# Patient Record
Sex: Male | Born: 1950
Health system: Southern US, Community
[De-identification: ages and names within clinical notes are randomized; demographics above are authoritative.]

## PROBLEM LIST (undated history)

## (undated) DIAGNOSIS — K219 Gastro-esophageal reflux disease without esophagitis: Secondary | ICD-10-CM

## (undated) DIAGNOSIS — T7840XA Allergy, unspecified, initial encounter: Secondary | ICD-10-CM

## (undated) DIAGNOSIS — I10 Essential (primary) hypertension: Secondary | ICD-10-CM

## (undated) DIAGNOSIS — M1990 Unspecified osteoarthritis, unspecified site: Secondary | ICD-10-CM

## (undated) HISTORY — DX: Allergy, unspecified, initial encounter: T78.40XA

## (undated) HISTORY — DX: Gastro-esophageal reflux disease without esophagitis: K21.9

## (undated) HISTORY — PX: FRACTURE SURGERY: SHX138

## (undated) HISTORY — DX: Unspecified osteoarthritis, unspecified site: M19.90

---

## 2004-09-10 ENCOUNTER — Ambulatory Visit: Payer: Self-pay | Admitting: Internal Medicine

## 2004-09-10 LAB — HM COLONOSCOPY

## 2009-11-08 ENCOUNTER — Emergency Department: Payer: Self-pay | Admitting: Emergency Medicine

## 2009-11-12 ENCOUNTER — Ambulatory Visit: Payer: Self-pay | Admitting: Unknown Physician Specialty

## 2012-05-06 ENCOUNTER — Ambulatory Visit: Payer: Self-pay | Admitting: Neurology

## 2012-06-06 ENCOUNTER — Ambulatory Visit: Payer: Self-pay | Admitting: Neurology

## 2012-07-04 ENCOUNTER — Ambulatory Visit: Payer: Self-pay | Admitting: Family Medicine

## 2012-07-29 ENCOUNTER — Ambulatory Visit: Payer: Self-pay | Admitting: Urology

## 2012-08-07 ENCOUNTER — Ambulatory Visit: Payer: Self-pay | Admitting: Urology

## 2012-08-08 LAB — PATHOLOGY REPORT

## 2014-08-31 ENCOUNTER — Emergency Department: Payer: Self-pay | Admitting: Emergency Medicine

## 2014-08-31 LAB — CBC WITH DIFFERENTIAL/PLATELET
BASOS ABS: 0 10*3/uL (ref 0.0–0.1)
Basophil %: 0.3 %
Eosinophil #: 0 10*3/uL (ref 0.0–0.7)
Eosinophil %: 0 %
HCT: 46.9 % (ref 40.0–52.0)
HGB: 15.3 g/dL (ref 13.0–18.0)
LYMPHS PCT: 7 %
Lymphocyte #: 0.9 10*3/uL — ABNORMAL LOW (ref 1.0–3.6)
MCH: 28.4 pg (ref 26.0–34.0)
MCHC: 32.8 g/dL (ref 32.0–36.0)
MCV: 87 fL (ref 80–100)
Monocyte #: 0.6 x10 3/mm (ref 0.2–1.0)
Monocyte %: 4.6 %
NEUTROS PCT: 88.1 %
Neutrophil #: 11.5 10*3/uL — ABNORMAL HIGH (ref 1.4–6.5)
Platelet: 180 10*3/uL (ref 150–440)
RBC: 5.41 10*6/uL (ref 4.40–5.90)
RDW: 13.9 % (ref 11.5–14.5)
WBC: 13 10*3/uL — ABNORMAL HIGH (ref 3.8–10.6)

## 2014-08-31 LAB — COMPREHENSIVE METABOLIC PANEL
Albumin: 4.1 g/dL (ref 3.4–5.0)
Alkaline Phosphatase: 71 U/L
Anion Gap: 12 (ref 7–16)
BILIRUBIN TOTAL: 0.7 mg/dL (ref 0.2–1.0)
BUN: 18 mg/dL (ref 7–18)
Calcium, Total: 8.9 mg/dL (ref 8.5–10.1)
Chloride: 99 mmol/L (ref 98–107)
Co2: 28 mmol/L (ref 21–32)
Creatinine: 1.16 mg/dL (ref 0.60–1.30)
EGFR (African American): >60
EGFR (Non-African Amer.): >60
Glucose: 123 mg/dL — ABNORMAL HIGH (ref 65–99)
Osmolality: 281 (ref 275–301)
Potassium: 3.8 mmol/L (ref 3.5–5.1)
SGOT(AST): 26 U/L (ref 15–37)
SGPT (ALT): 35 U/L
SODIUM: 139 mmol/L (ref 136–145)
Total Protein: 8.1 g/dL (ref 6.4–8.2)

## 2014-08-31 LAB — URINALYSIS, COMPLETE
Bacteria: NONE SEEN
Bilirubin,UR: NEGATIVE
Glucose,UR: NEGATIVE mg/dL (ref 0–75)
Leukocyte Esterase: NEGATIVE
Nitrite: NEGATIVE
Ph: 5 (ref 4.5–8.0)
Protein: 30
SPECIFIC GRAVITY: 1.024 (ref 1.003–1.030)

## 2014-08-31 LAB — LIPASE, BLOOD: LIPASE: 96 U/L (ref 73–393)

## 2014-09-03 ENCOUNTER — Ambulatory Visit: Payer: Self-pay | Admitting: Family Medicine

## 2014-11-13 ENCOUNTER — Ambulatory Visit: Payer: Self-pay | Admitting: Family Medicine

## 2014-11-16 ENCOUNTER — Ambulatory Visit: Payer: Self-pay | Admitting: Family Medicine

## 2014-11-19 ENCOUNTER — Ambulatory Visit: Payer: Self-pay | Admitting: Family Medicine

## 2015-02-11 LAB — HEPATIC FUNCTION PANEL
ALK PHOS: 59 U/L (ref 25–125)
ALT: 23 U/L (ref 10–40)
AST: 21 U/L (ref 14–40)

## 2015-02-11 LAB — LIPID PANEL
CHOLESTEROL: 208 mg/dL — AB (ref 0–200)
HDL: 34 mg/dL — AB (ref 35–70)
LDL Cholesterol: 148 mg/dL
LDl/HDL Ratio: 4.4
Triglycerides: 129 mg/dL (ref 40–160)

## 2015-02-11 LAB — CBC AND DIFFERENTIAL
HEMATOCRIT: 46 % (ref 41–53)
Hemoglobin: 15.1 g/dL (ref 13.5–17.5)
NEUTROS ABS: 3 /uL
Platelets: 185 10*3/uL (ref 150–399)
WBC: 5.3 10*3/mL

## 2015-02-11 LAB — BASIC METABOLIC PANEL
BUN: 18 mg/dL (ref 4–21)
Creatinine: 1.1 mg/dL (ref 0.6–1.3)
GLUCOSE: 95 mg/dL
Potassium: 5 mmol/L (ref 3.4–5.3)
SODIUM: 141 mmol/L (ref 137–147)

## 2015-02-11 LAB — TSH: TSH: 2.39 u[IU]/mL (ref 0.41–5.90)

## 2015-02-16 NOTE — Op Note (Signed)
PATIENT NAME:  Jonathon GrovesWILSON, Ollivander K MR#:  [id number] DATE OF BIRTH:  Mar 06, 1951  DATE OF PROCEDURE:  08/07/2012  PREOPERATIVE DIAGNOSIS: Phimosis with chronic balanitis.   POSTOPERATIVE DIAGNOSIS: Phimosis with chronic balanitis.   PROCEDURE: Circumcision.   SURGEON: Irineo AxonScott Stoioff, M.D.   ASSISTANT: None.   ANESTHESIA: General.   INDICATIONS: This is a 64 year old male with a six-month history of progressively worsening penile pain, ulceration, fissuring, inability to retract his foreskin. 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   DESCRIPTION OF PROCEDURE: The patient was taken to the Operating Room where a general anesthetic was administered. He was placed in the supine position and his external genitalia were prepped in the usual fashion. Time out was performed. His prepuce was unable to be retracted. The foreskin was excised via a dorsal/ventral slit technique. The intervening flaps of preputial tissue were sharply excised. Hemostasis was obtained with cautery. Frenulum was reapproximated with a running 4-0 chromic suture. Skin edges were reapproximated with interrupted 3-0 chromic suture. Dorsal penile block was performed with 7 mL of 0.5% plain Sensorcaine. Dressing of Vaseline gauze, Kling, and mesh was applied. He was taken to PAC-U in stable condition. There were no complications. EBL minimal. ____________________________ Verna CzechScott C. Lonna CobbStoioff, MD scs:slb D: 08/07/2012 08:47:18 ET     T: 08/07/2012 09:23:40 ET        JOB#: [id number] cc: Lorin PicketScott C. Lonna CobbStoioff, MD, <Dictator> Riki AltesSCOTT C STOIOFF MD ELECTRONICALLY SIGNED 08/07/2012 15:30

## 2015-08-06 DIAGNOSIS — I10 Essential (primary) hypertension: Secondary | ICD-10-CM | POA: Insufficient documentation

## 2015-08-06 DIAGNOSIS — M1990 Unspecified osteoarthritis, unspecified site: Secondary | ICD-10-CM | POA: Insufficient documentation

## 2015-08-06 DIAGNOSIS — L639 Alopecia areata, unspecified: Secondary | ICD-10-CM | POA: Insufficient documentation

## 2015-08-06 DIAGNOSIS — E785 Hyperlipidemia, unspecified: Secondary | ICD-10-CM | POA: Insufficient documentation

## 2015-08-06 DIAGNOSIS — M722 Plantar fascial fibromatosis: Secondary | ICD-10-CM | POA: Insufficient documentation

## 2015-08-06 DIAGNOSIS — K449 Diaphragmatic hernia without obstruction or gangrene: Secondary | ICD-10-CM | POA: Insufficient documentation

## 2015-08-06 DIAGNOSIS — E668 Other obesity: Secondary | ICD-10-CM | POA: Insufficient documentation

## 2015-08-06 DIAGNOSIS — N529 Male erectile dysfunction, unspecified: Secondary | ICD-10-CM | POA: Insufficient documentation

## 2015-08-09 ENCOUNTER — Encounter: Payer: Self-pay | Admitting: Family Medicine

## 2015-08-09 ENCOUNTER — Ambulatory Visit (INDEPENDENT_AMBULATORY_CARE_PROVIDER_SITE_OTHER): Payer: PRIVATE HEALTH INSURANCE | Admitting: Family Medicine

## 2015-08-09 VITALS — BP 160/82 | HR 62 | Temp 97.3°F | Resp 16 | Ht 71.0 in | Wt 221.0 lb

## 2015-08-09 DIAGNOSIS — R634 Abnormal weight loss: Secondary | ICD-10-CM

## 2015-08-09 DIAGNOSIS — I10 Essential (primary) hypertension: Secondary | ICD-10-CM

## 2015-08-09 DIAGNOSIS — E669 Obesity, unspecified: Secondary | ICD-10-CM | POA: Insufficient documentation

## 2015-08-09 DIAGNOSIS — E785 Hyperlipidemia, unspecified: Secondary | ICD-10-CM

## 2015-08-09 DIAGNOSIS — M109 Gout, unspecified: Secondary | ICD-10-CM | POA: Insufficient documentation

## 2015-08-09 NOTE — Progress Notes (Signed)
Patient ID: Jonathon Bell, male   DOB: 12-02-1950, 64 y.o.   MRN: [id number]   Jonathon Bell  MRN: [id number] DOB: 04-30-1951  Subjective:  HPI   1. Essential hypertension Patient is a 64 year old male who presents for follow up of his hypertension.  His last visit was 6 months ago.  No management changes were made at that time and his reading in the office was 132/70.  Since his last visit the patient has changed his eating habits and is going to the gym twice per week.  He has lost 75 pounds during that time. He has discontinued all medications.  He reported that he was having some dizziness and thought it to be his blood pressure.  He has had his pressure checked outside of our office since stopping the Lisinopril and reports it was good, 122/78 and 126/77 another time.  He feels well and has no complaints today.   Patient Active Problem List   Diagnosis Date Noted  . AA (alopecia areata) 08/06/2015  . Arthritis 08/06/2015  . Failure of erection 08/06/2015  . Essential (primary) hypertension 08/06/2015  . 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 HLD (hyperlipidemia) 08/06/2015  . Extreme obesity (HCC) 08/06/2015  . Plantar fasciitis 08/06/2015    No past medical history on file.  Social History   Social History  . Marital Status: Divorced    Spouse Name: N/A  . Number of Children: N/A  . Years of Education: N/A   Occupational History  . Not on file.   Social History Main Topics  . Smoking status: Former Smoker -- 0.50 packs/day for 6 years    Types: Cigarettes  . Smokeless tobacco: Former Neurosurgeon    Quit date: 10/31/1979  . Alcohol Use: No  . Drug Use: No  . Sexual Activity: Not on file   Other Topics Concern  . Not on file   Social History Narrative    Outpatient Prescriptions Prior to Visit  Medication Sig Dispense Refill  . Glucosamine-Chondroit-Vit C-Mn (GLUCOSAMINE CHONDR 1500 COMPLX) CAPS Take by mouth.    Marland Kitchen lisinopril-hydrochlorothiazide (PRINZIDE,ZESTORETIC)  20-12.5 MG tablet Take by mouth.    . Naproxen Sod-Diphenhydramine (ALEVE PM) 220-25 MG TABS Take by mouth.    Marland Kitchen omeprazole (PRILOSEC) 20 MG capsule Take by mouth.    . ondansetron (ZOFRAN) 8 MG tablet Take by mouth.    . promethazine (PHENERGAN) 25 MG suppository Place rectally.     No facility-administered medications prior to visit.    No Known Allergies  Review of Systems  Constitutional: Negative for fever and chills.  Eyes: Negative.   Respiratory: Negative.   Cardiovascular: Negative.   Gastrointestinal: Negative.        Patient states he does feel like he has gas.  Genitourinary: Negative.   Neurological: Negative.  Negative for weakness and headaches.  Endo/Heme/Allergies: Negative.   Psychiatric/Behavioral: Negative.    Objective:  BP 160/82 mmHg  Pulse 62  Temp(Src) 97.3 F (36.3 C) (Oral)  Resp 16  Ht  (1.803 m)  Wt 221 lb (100.245 kg)  BMI 30.84 kg/m2  Physical Exam  Constitutional: He is oriented to person, place, and time and well-developed, well-nourished, and in no distress.  HENT:  Head: Normocephalic and atraumatic.  Right Ear: External ear normal.  Left Ear: External ear normal.  Nose: Nose normal.  Eyes: Conjunctivae are normal.  Neck: Neck supple.  Cardiovascular: Normal rate, regular rhythm and normal heart sounds.   Pulmonary/Chest: Effort normal and  breath sounds normal.  Abdominal: Soft.  Neurological: He is alert and oriented to person, place, and time.  Skin: Skin is warm and dry.  Psychiatric: Mood, memory, affect and judgment normal.    Assessment and Plan :  Essential hypertension  OK off of meds with this weight loss. RTC 3 months with home BP readings,Seemsd to have some "white coat hypertension". Morbid Obesity Pt says the 75 lb weight loss is all intentional and he feels well. Obtain labs to f/u on this weight loss. Alopecia Areata HLD  Julieanne Manson MD [facility] 08/09/2015 8:16 AM

## 2015-08-10 LAB — CBC WITH DIFFERENTIAL/PLATELET
BASOS ABS: 0 10*3/uL (ref 0.0–0.2)
Basos: 0 %
EOS (ABSOLUTE): 0.2 10*3/uL (ref 0.0–0.4)
EOS: 4 %
HEMATOCRIT: 44.5 % (ref 37.5–51.0)
HEMOGLOBIN: 15 g/dL (ref 12.6–17.7)
IMMATURE GRANULOCYTES: 0 %
Immature Grans (Abs): 0 10*3/uL (ref 0.0–0.1)
LYMPHS ABS: 1 10*3/uL (ref 0.7–3.1)
LYMPHS: 25 %
MCH: 28.4 pg (ref 26.6–33.0)
MCHC: 33.7 g/dL (ref 31.5–35.7)
MCV: 84 fL (ref 79–97)
MONOCYTES: 8 %
Monocytes Absolute: 0.3 10*3/uL (ref 0.1–0.9)
NEUTROS PCT: 63 %
Neutrophils Absolute: 2.6 10*3/uL (ref 1.4–7.0)
Platelets: 143 10*3/uL — ABNORMAL LOW (ref 150–379)
RBC: 5.28 x10E6/uL (ref 4.14–5.80)
RDW: 15.2 % (ref 12.3–15.4)
WBC: 4.1 10*3/uL (ref 3.4–10.8)

## 2015-08-10 LAB — COMPREHENSIVE METABOLIC PANEL
ALBUMIN: 4.4 g/dL (ref 3.6–4.8)
ALK PHOS: 76 IU/L (ref 39–117)
ALT: 8 IU/L (ref 0–44)
AST: 13 IU/L (ref 0–40)
Albumin/Globulin Ratio: 1.8 (ref 1.1–2.5)
BUN/Creatinine Ratio: 13 (ref 10–22)
BUN: 11 mg/dL (ref 8–27)
Bilirubin Total: 0.6 mg/dL (ref 0.0–1.2)
CALCIUM: 9.2 mg/dL (ref 8.6–10.2)
CO2: 22 mmol/L (ref 18–29)
CREATININE: 0.82 mg/dL (ref 0.76–1.27)
Chloride: 102 mmol/L (ref 97–108)
GFR calc Af Amer: 110 mL/min/{1.73_m2} (ref >59)
GFR, EST NON AFRICAN AMERICAN: 95 mL/min/{1.73_m2} (ref >59)
GLUCOSE: 92 mg/dL (ref 65–99)
Globulin, Total: 2.5 g/dL (ref 1.5–4.5)
Potassium: 4.4 mmol/L (ref 3.5–5.2)
Sodium: 142 mmol/L (ref 134–144)
Total Protein: 6.9 g/dL (ref 6.0–8.5)

## 2015-08-10 LAB — LIPID PANEL WITH LDL/HDL RATIO
CHOLESTEROL TOTAL: 251 mg/dL — AB (ref 100–199)
HDL: 37 mg/dL — AB (ref >39)
LDL CALC: 194 mg/dL — AB (ref 0–99)
LDl/HDL Ratio: 5.2 ratio units — ABNORMAL HIGH (ref 0.0–3.6)
Triglycerides: 98 mg/dL (ref 0–149)
VLDL CHOLESTEROL CAL: 20 mg/dL (ref 5–40)

## 2015-08-10 LAB — TSH: TSH: 2.01 u[IU]/mL (ref 0.450–4.500)

## 2015-08-11 ENCOUNTER — Other Ambulatory Visit: Payer: Self-pay | Admitting: Emergency Medicine

## 2015-08-11 DIAGNOSIS — E785 Hyperlipidemia, unspecified: Secondary | ICD-10-CM

## 2015-08-11 MED ORDER — ATORVASTATIN CALCIUM 10 MG PO TABS: 10.0000 mg | ORAL_TABLET | Freq: Every day | ORAL | Status: DC

## 2015-11-15 ENCOUNTER — Other Ambulatory Visit: Payer: Self-pay

## 2016-08-14 IMAGING — NM NUCLEAR MEDICINE HEPATOHBILIARY INCLUDE GB
2 series · 12 of 12 positions shown · non-contrast
Comparison: None ; correlation ultrasound abdomen 11/16/2014

CLINICAL DATA: Nausea, vomiting, abdominal pain, negative
ultrasound

EXAM:
NUCLEAR MEDICINE HEPATOBILIARY IMAGING WITH GALLBLADDER EF
TECHNIQUE: Sequential images of the abdomen were obtained [DATE] minutes
following intravenous administration of radiopharmaceutical. After
slow intravenous infusion of 2.61 micrograms Cholecystokinin,
gallbladder ejection fraction was determined.
RADIOPHARMACEUTICALS:  8.79 mCi Rc-AAm Choletec IV

[Series 1000: gallbladder dynamic (results) · 4.80mm/px · 6 of 120 frames shown]
[frame 11/120]
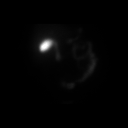
[frame 31/120]
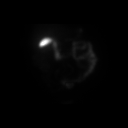
[frame 51/120]
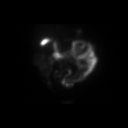
[frame 71/120]
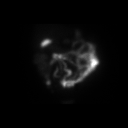
[frame 91/120]
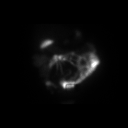
[frame 111/120]
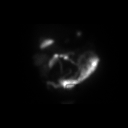

[Series 1000: gallbladder dynamic · 4.80mm/px · 6 of 120 frames shown]
[frame 11/120]
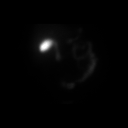
[frame 31/120]
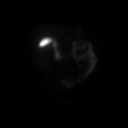
[frame 51/120]
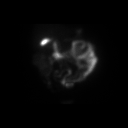
[frame 71/120]
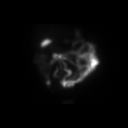
[frame 91/120]
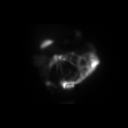
[frame 111/120]
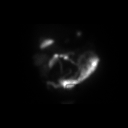

[12 of 12 positions shown; findings below may reference images not displayed]

FINDINGS: Normal tracer extraction from bloodstream indicating normal
hepatocellular function.

Normal excretion of tracer into biliary tree.

Gallbladder visualized at 10 min.

Small bowel visualized at 60 min.

No hepatic retention of tracer.

Subjectively normal emptying of tracer from gallbladder following
CCK administration.

Calculated gallbladder ejection fraction is 76%, normal.

Patient experienced a mild gas like feeling following CCK
administration.
IMPRESSION: Normal exam.

## 2017-10-31 ENCOUNTER — Ambulatory Visit (INDEPENDENT_AMBULATORY_CARE_PROVIDER_SITE_OTHER): Payer: 59 | Admitting: Family Medicine

## 2017-10-31 ENCOUNTER — Encounter: Payer: Self-pay | Admitting: Family Medicine

## 2017-10-31 VITALS — BP 120/80 | HR 92 | Temp 98.2°F | Resp 16 | Ht 71.0 in | Wt 246.0 lb

## 2017-10-31 DIAGNOSIS — I10 Essential (primary) hypertension: Secondary | ICD-10-CM | POA: Diagnosis not present

## 2017-10-31 DIAGNOSIS — E785 Hyperlipidemia, unspecified: Secondary | ICD-10-CM | POA: Diagnosis not present

## 2017-10-31 DIAGNOSIS — Z0000 Encounter for general adult medical examination without abnormal findings: Secondary | ICD-10-CM

## 2017-10-31 LAB — IFOBT (OCCULT BLOOD): IFOBT: NEGATIVE

## 2017-10-31 NOTE — Progress Notes (Signed)
poct      Patient: Jonathon Bell, Male    DOB: 11/27/1950, 67 y.o.   MRN: [id number] Visit Date: 10/31/2017  Today's Provider: Megan Mansichard Gilbert Jr, MD   Chief Complaint  Patient presents with  . Medicare Wellness   Subjective:    Annual physical exam Jonathon Bell is a 67 y.o. male who presents today for health maintenance and complete physical. He feels fairly well. He reports exercising none. He reports he is sleeping fairly well. 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 He has 3 children and 2 grandchildren.  ----------------------------------------------------------------    Hypertension, follow-up:  BP Readings from Last 3 Encounters:  10/31/17 120/80  08/09/15 (!) 160/82  02/08/15 132/70    He was last seen for hypertension 08/09/2015.  Management since that visit includes; off medications. Stable with weight loss. Advised to follow-up in 3 months.He reports good compliance with treatment. He is not having side effects. none He is not exercising. He is not adherent to low salt diet.   Outside blood pressures are not checking. He is experiencing none.  Patient denies none.   Cardiovascular risk factors include advanced age (older than 5055 for men, 1765 for women).  Use of agents associated with hypertension: none.   ----------------------------------------------------------------  Morbid Obesity From 08/09/2015-Pt reports 75 lb weight loss and he feels well.  Alopecia Areata From 08/09/2015-HLD  Labs done 08/09/2015-showing cholesterol was to high. Started atorvastatin 10 mg qd.   Review of Systems  Constitutional: Negative.   HENT: Negative.   Eyes: Negative.   Respiratory: Negative.   Cardiovascular: Negative.   Gastrointestinal: Negative.   Endocrine: Negative.   Genitourinary: Negative.   Musculoskeletal: Negative.   Allergic/Immunologic: Negative.   Neurological: Negative.   Hematological: Negative.   Psychiatric/Behavioral: Negative.       Social History      He  reports that he has quit smoking. His smoking use included cigarettes. He has a 3.00 pack-year smoking history. He quit smokeless tobacco use about 38 years ago. He reports that he does not drink alcohol or use drugs.       Social History   Socioeconomic History  . Marital status: Divorced    Spouse name: None  . Number of children: None  . Years of education: None  . Highest education level: None  Social Needs  . Financial resource strain: None  . Food insecurity - worry: None  . Food insecurity - inability: None  . Transportation needs - medical: None  . Transportation needs - non-medical: None  Occupational History  . None  Tobacco Use  . Smoking status: Former Smoker    Packs/day: 0.50    Years: 6.00    Pack years: 3.00    Types: Cigarettes  . Smokeless tobacco: Former NeurosurgeonUser    Quit date: 10/31/1979  Substance and Sexual Activity  . Alcohol use: No    Alcohol/week: 0.0 oz  . Drug use: No  . Sexual activity: None  Other Topics Concern  . None  Social History Narrative  . None    History reviewed. No pertinent past medical history.   Patient Active Problem List   Diagnosis Date Noted  . Gout 08/09/2015  . Adiposity 08/09/2015  . AA (alopecia areata) 08/06/2015  . Arthritis 08/06/2015  . Failure of erection 08/06/2015  . Essential (primary) hypertension 08/06/2015  . 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 HLD (hyperlipidemia) 08/06/2015  . Extreme obesity 08/06/2015  . Plantar fasciitis 08/06/2015  Past Surgical History:  Procedure Laterality Date  . FRACTURE SURGERY     arm requiring surgery on tendons    Family History        Family Status  Relation Name Status  . Mother  Deceased at age 36  . Father  Deceased at age 70  . Sister  Deceased  . Brother  Alive        His family history includes Cirrhosis in his sister; Diabetes in his sister; Heart disease in his mother and sister; Hypertension in his mother; Lung cancer in  his father.     No Known Allergies   Current Outpatient Medications:  .  atorvastatin (LIPITOR) 10 MG tablet, Take 1 tablet (10 mg total) by mouth daily. (Patient not taking: Reported on 10/31/2017), Disp: 30 tablet, Rfl: 12   Patient Care Team: Maple Hudson., MD as PCP - General (Family Medicine)      Objective:   Vitals: BP 120/80 (BP Location: Left Arm, Patient Position: Sitting, Cuff Size: Large)   Pulse 92   Temp 98.2 F (36.8 C) (Oral)   Resp 16   Ht 5\' 11"  (1.803 m)   Wt 246 lb (111.6 kg)   SpO2 96%   BMI 34.31 kg/m    Vitals:   10/31/17 1014  BP: 120/80  Pulse: 92  Resp: 16  Temp: 98.2 F (36.8 C)  TempSrc: Oral  SpO2: 96%  Weight: 246 lb (111.6 kg)  Height: 5\' 11"  (1.803 m)     Physical Exam  Constitutional: He is oriented to person, place, and time. He appears well-developed and well-nourished.  HENT:  Head: Normocephalic and atraumatic.  Right Ear: External ear normal.  Left Ear: External ear normal.  Nose: Nose normal.  Mouth/Throat: Oropharynx is clear and moist.  Eyes: Conjunctivae are normal. No scleral icterus.  Neck: No thyromegaly present.  Cardiovascular: Normal rate, regular rhythm, normal heart sounds and intact distal pulses.  Pulmonary/Chest: Effort normal and breath sounds normal.  Abdominal: Soft.  Genitourinary: Rectum normal, prostate normal and penis normal.  Musculoskeletal: Normal range of motion.  Lymphadenopathy:    He has no cervical adenopathy.  Neurological: He is alert and oriented to person, place, and time.  Skin: Skin is warm and dry.  Psychiatric: He has a normal mood and affect. His behavior is normal. Judgment and thought content normal.     Depression Screen PHQ 2/9 Scores 10/31/2017  PHQ - 2 Score 1  PHQ- 9 Score 3     Visual Acuity Screening   Right eye Left eye Both eyes  Without correction: 20/25 20/20 20/20   With correction:        Assessment & Plan:     Routine Health Maintenance and  Physical Exam  Exercise Activities and Dietary recommendations Goals    None      Immunization History  Administered Date(s) Administered  . Td 08/11/2004  . Tdap 05/26/2014  . Zoster 05/26/2014    Health Maintenance  Topic Date Due  . Hepatitis C Screening  1951-02-16  . HIV Screening  10/18/1967  . COLONOSCOPY  09/10/2014  . INFLUENZA VACCINE  05/30/2018 (Originally 05/30/2017)  . PNA vac Low Risk Adult (1 of 2 - PCV13) 10/31/2018 (Originally 10/17/2017)  . TETANUS/TDAP  05/26/2024     Discussed health benefits of physical activity, and encouraged him to engage in regular exercise appropriate for his age and condition.  Obesity Pt has kept off 50 lbs from 75 that he lost in  last 2 years.   --------------------------------------------------------------------   I have done the exam and reviewed the above chart and it is accurate to the best of my knowledge. Dentist has been used in this note in any air is in the dictation or transcription are unintentional.  Megan Mans, MD  [facility]

## 2017-10-31 NOTE — Progress Notes (Deleted)
Patient: Jonathon Bell, Male    DOB: 09-Jan-1951, 67 y.o.   MRN: [id number] Visit Date: 10/31/2017  Today's Provider: Megan Mans, MD   Chief Complaint  Patient presents with  . Medicare Wellness   Subjective:   Initial preventative physical exam Jonathon Bell is a 67 y.o. male who presents today for his Initial Preventative Physical Exam. He feels well. He reports exercising none. He reports he is sleeping fairly well.     Hypertension, follow-up:  BP Readings from Last 3 Encounters:  10/31/17 120/80  08/09/15 (!) 160/82  02/08/15 132/70    He was last seen for hypertension 08/09/2015.  Management since that visit includes; off medications. Stable with weight loss. Advised to follow-up in 3 months.He reports good compliance with treatment. He is not having side effects. none He is not exercising. He is not adherent to low salt diet.   Outside blood pressures are not checking. He is experiencing none.  Patient denies none.   Cardiovascular risk factors include advanced age (older than 31 for men, 49 for women).  Use of agents associated with hypertension: none.   ----------------------------------------------------------------  Morbid Obesity From 08/09/2015-Pt reports 75 lb weight loss and he feels well.  Alopecia Areata From 08/09/2015-HLD  Labs done 08/09/2015-showing cholesterol was to high. Started atorvastatin 10 mg qd.   Review of Systems  HENT: Positive for tinnitus.   Musculoskeletal: Positive for arthralgias.  All other systems reviewed and are negative.   Social History   Socioeconomic History  . Marital status: Divorced    Spouse name: Not on file  . Number of children: Not on file  . Years of education: Not on file  . Highest education level: Not on file  Social Needs  . Financial resource strain: Not on file  . Food insecurity - worry: Not on file  . Food insecurity - inability: Not on file  . Transportation needs -  medical: Not on file  . Transportation needs - non-medical: Not on file  Occupational History  . Not on file  Tobacco Use  . Smoking status: Former Smoker    Packs/day: 0.50    Years: 6.00    Pack years: 3.00    Types: Cigarettes  . Smokeless tobacco: Former Neurosurgeon    Quit date: 10/31/1979  Substance and Sexual Activity  . Alcohol use: No    Alcohol/week: 0.0 oz  . Drug use: No  . Sexual activity: Not on file  Other Topics Concern  . Not on file  Social History Narrative  . Not on file    History reviewed. No pertinent past medical history.   Patient Active Problem List   Diagnosis Date Noted  . Gout 08/09/2015  . Adiposity 08/09/2015  . AA (alopecia areata) 08/06/2015  . Arthritis 08/06/2015  . Failure of erection 08/06/2015  . Essential (primary) hypertension 08/06/2015  . 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 HLD (hyperlipidemia) 08/06/2015  . Extreme obesity 08/06/2015  . Plantar fasciitis 08/06/2015    Past Surgical History:  Procedure Laterality Date  . FRACTURE SURGERY     arm requiring surgery on tendons    His family history includes Cirrhosis in his sister; Diabetes in his sister; Heart disease in his mother and sister; Hypertension in his mother; Lung cancer in his father.      Current Outpatient Medications:  .  atorvastatin (LIPITOR) 10 MG tablet, Take 1 tablet (10 mg total) by mouth daily. (Patient not taking: Reported  on 10/31/2017), Disp: 30 tablet, Rfl: 12   Patient Care Team: Jonathon Bell., MD as PCP - General (Family Medicine)      Objective:   Vitals: BP 120/80 (BP Location: Left Arm, Patient Position: Sitting, Cuff Size: Large)   Pulse 92   Temp 98.2 F (36.8 C) (Oral)   Resp 16   Ht 5\' 11"  (1.803 m)   Wt 246 lb (111.6 kg)   SpO2 96%   BMI 34.31 kg/m   Physical Exam    Visual Acuity Screening   Right eye Left eye Both eyes  Without correction: 20/25 20/20 20/20   With correction:       Activities of Daily Living In your  present state of health, do you have any difficulty performing the following activities: 10/31/2017  Hearing? Y  Vision? N  Difficulty concentrating or making decisions? N  Walking or climbing stairs? N  Dressing or bathing? N  Doing errands, shopping? N  Some recent data might be hidden    Fall Risk Assessment Fall Risk  10/31/2017  Falls in the past year? Yes  Number falls in past yr: 1  Injury with Fall? Yes  Comment broken elbow at work     Depression Screen PHQ 2/9 Scores 10/31/2017  PHQ - 2 Score 1  PHQ- 9 Score 3    Cognitive Testing - 6-CIT  Correct? Score   What year is it? yes 0 0 or 4  What month is it? yes 0 0 or 3  Memorize:    Jonathon Bell,  43,  High [address]  [place],      What time is it? (within 1 hour) yes 0 0 or 3  Count backwards from 20 yes 0 0, 2, or 4  Name the months of the year yes 0 0, 2, or 4  Repeat name & address above yes 6 0, 2, 4, 6, 8, or 10       TOTAL SCORE  6/28   Interpretation:  Normal  Normal (0-7) Abnormal (8-28)   Audit-C Alcohol Use Screening  Question Answer Points  How often do you have alcoholic drink? never 0  On days you do drink alcohol, how many drinks do you typically consume? 0 0  How oftey will you drink 6 or more in a total? never 0  Total Score:  0   A score of 3 or more in women, and 4 or more in men indicates increased risk for alcohol abuse, EXCEPT if all of the points are from question 1.     Assessment & Plan:     Initial Preventative Physical Exam  Reviewed patient's Family Medical History Reviewed and updated list of patient's medical providers Assessment of cognitive impairment was done Assessed patient's functional ability Established a written schedule for health screening services Health Risk Assessent Completed and Reviewed  Exercise Activities and Dietary recommendations Goals    None      Immunization History  Administered Date(s) Administered  . Td 08/11/2004  . Tdap 05/26/2014  .  Zoster 05/26/2014    Health Maintenance  Topic Date Due  . Hepatitis C Screening  June 07, 1951  . HIV Screening  10/18/1967  . COLONOSCOPY  09/10/2014  . INFLUENZA VACCINE  05/30/2017  . PNA vac Low Risk Adult (1 of 2 - PCV13) 10/17/2017  . TETANUS/TDAP  05/26/2024     Discussed health benefits of physical activity, and encouraged him to engage in regular exercise appropriate for his age and  condition.    --------------------------------------------------------------------------    Megan Mansichard Gilbert Jr, MD  [facility]

## 2017-10-31 NOTE — Patient Instructions (Signed)
Get labs done that were , Lipid panel, CBC, TSH, CMET, and PSA  Return for office follow-up in 6 months.

## 2017-11-01 LAB — PSA: PROSTATE SPECIFIC AG, SERUM: 1 ng/mL (ref 0.0–4.0)

## 2017-11-01 LAB — LIPID PANEL
CHOLESTEROL TOTAL: 261 mg/dL — AB (ref 100–199)
Chol/HDL Ratio: 5.3 ratio — ABNORMAL HIGH (ref 0.0–5.0)
HDL: 49 mg/dL (ref >39)
LDL CALC: 197 mg/dL — AB (ref 0–99)
Triglycerides: 73 mg/dL (ref 0–149)
VLDL Cholesterol Cal: 15 mg/dL (ref 5–40)

## 2017-11-01 LAB — CBC WITH DIFFERENTIAL/PLATELET
BASOS: 0 %
Basophils Absolute: 0 10*3/uL (ref 0.0–0.2)
EOS (ABSOLUTE): 0.1 10*3/uL (ref 0.0–0.4)
EOS: 3 %
HEMATOCRIT: 44 % (ref 37.5–51.0)
HEMOGLOBIN: 15.1 g/dL (ref 13.0–17.7)
IMMATURE GRANS (ABS): 0 10*3/uL (ref 0.0–0.1)
Immature Granulocytes: 1 %
LYMPHS: 23 %
Lymphocytes Absolute: 1 10*3/uL (ref 0.7–3.1)
MCH: 27.6 pg (ref 26.6–33.0)
MCHC: 34.3 g/dL (ref 31.5–35.7)
MCV: 80 fL (ref 79–97)
Monocytes Absolute: 0.3 10*3/uL (ref 0.1–0.9)
Monocytes: 7 %
NEUTROS ABS: 2.8 10*3/uL (ref 1.4–7.0)
Neutrophils: 66 %
Platelets: 157 10*3/uL (ref 150–379)
RBC: 5.48 x10E6/uL (ref 4.14–5.80)
RDW: 14.5 % (ref 12.3–15.4)
WBC: 4.2 10*3/uL (ref 3.4–10.8)

## 2017-11-01 LAB — COMPREHENSIVE METABOLIC PANEL
A/G RATIO: 2 (ref 1.2–2.2)
ALBUMIN: 4.9 g/dL — AB (ref 3.6–4.8)
ALT: 26 IU/L (ref 0–44)
AST: 20 IU/L (ref 0–40)
Alkaline Phosphatase: 47 IU/L (ref 39–117)
BILIRUBIN TOTAL: 0.3 mg/dL (ref 0.0–1.2)
BUN / CREAT RATIO: 17 (ref 10–24)
BUN: 17 mg/dL (ref 8–27)
CALCIUM: 9.2 mg/dL (ref 8.6–10.2)
CHLORIDE: 103 mmol/L (ref 96–106)
CO2: 23 mmol/L (ref 20–29)
Creatinine, Ser: 1.02 mg/dL (ref 0.76–1.27)
GFR calc Af Amer: 89 mL/min/{1.73_m2} (ref >59)
GFR, EST NON AFRICAN AMERICAN: 77 mL/min/{1.73_m2} (ref >59)
GLOBULIN, TOTAL: 2.4 g/dL (ref 1.5–4.5)
Glucose: 96 mg/dL (ref 65–99)
POTASSIUM: 4.7 mmol/L (ref 3.5–5.2)
Sodium: 142 mmol/L (ref 134–144)
Total Protein: 7.3 g/dL (ref 6.0–8.5)

## 2017-11-01 LAB — TSH: TSH: 2.62 u[IU]/mL (ref 0.450–4.500)

## 2017-11-05 ENCOUNTER — Telehealth: Payer: Self-pay

## 2017-11-05 NOTE — Telephone Encounter (Signed)
-----   Message from Maple Hudsonichard L Gilbert Jr., MD sent at 11/05/2017  8:21 AM EST ----- Recheck lipids after 6 months D and E--add metamucil 1 scoop daily.

## 2017-11-05 NOTE — Telephone Encounter (Signed)
Pt advised.   Thanks,   -Laura  

## 2017-11-28 ENCOUNTER — Encounter: Payer: Self-pay | Admitting: Family Medicine

## 2018-08-23 DIAGNOSIS — M25561 Pain in right knee: Secondary | ICD-10-CM | POA: Diagnosis not present

## 2018-08-23 DIAGNOSIS — S8991XA Unspecified injury of right lower leg, initial encounter: Secondary | ICD-10-CM | POA: Diagnosis not present

## 2018-08-23 DIAGNOSIS — M7989 Other specified soft tissue disorders: Secondary | ICD-10-CM | POA: Diagnosis not present

## 2018-08-23 DIAGNOSIS — M1711 Unilateral primary osteoarthritis, right knee: Secondary | ICD-10-CM | POA: Diagnosis not present

## 2020-05-19 DIAGNOSIS — M170 Bilateral primary osteoarthritis of knee: Secondary | ICD-10-CM | POA: Insufficient documentation

## 2021-01-19 ENCOUNTER — Other Ambulatory Visit: Payer: Self-pay

## 2021-01-19 ENCOUNTER — Ambulatory Visit (INDEPENDENT_AMBULATORY_CARE_PROVIDER_SITE_OTHER): Payer: Medicare Other | Admitting: Family Medicine

## 2021-01-19 ENCOUNTER — Encounter: Payer: Self-pay | Admitting: Family Medicine

## 2021-01-19 VITALS — BP 161/82 | HR 70 | Temp 98.5°F | Resp 16 | Ht 72.0 in | Wt 290.0 lb

## 2021-01-19 DIAGNOSIS — Z6839 Body mass index (BMI) 39.0-39.9, adult: Secondary | ICD-10-CM

## 2021-01-19 DIAGNOSIS — G5711 Meralgia paresthetica, right lower limb: Secondary | ICD-10-CM | POA: Diagnosis not present

## 2021-01-19 DIAGNOSIS — I10 Essential (primary) hypertension: Secondary | ICD-10-CM | POA: Diagnosis not present

## 2021-01-19 DIAGNOSIS — E7800 Pure hypercholesterolemia, unspecified: Secondary | ICD-10-CM

## 2021-01-19 MED ORDER — NAPROXEN 500 MG PO TABS: ORAL_TABLET | ORAL | 0 refills | Status: DC

## 2021-01-19 NOTE — Patient Instructions (Signed)
Try Lennart Pall or Omicron BP cuffs and monitor BP at home.

## 2021-01-19 NOTE — Progress Notes (Signed)
      Established patient visit   Patient: Jonathon Bell   DOB: 08/14/1951   70 y.o. Male  MRN: [id number] Visit Date: 01/19/2021  Today's healthcare provider: Megan Mans, MD   Chief Complaint  Patient presents with  . thigh pain   Subjective    HPI  Patient presents today c/o pain in his right thigh. He reports that he has had pain X 1 month. He denies any injuries. He describes it as "pins and needles". He reports that his symptoms are constant. He has not used anything OTC to help with his symptoms.  He denies any known trauma.  The discomfort is in the right lateral thigh  with a numbness/tingling/burning at times.  No known trauma. He has not been in in 3 years.  Overall he is feeling well.  He is on no medication.      Medications: Outpatient Medications Prior to Visit  Medication Sig  . atorvastatin (LIPITOR) 10 MG tablet Take 1 tablet (10 mg total) by mouth daily. (Patient not taking: No sig reported)   No facility-administered medications prior to visit.    Review of Systems  Constitutional: Negative for activity change and fever.  Musculoskeletal: Positive for myalgias. Negative for arthralgias and back pain.  Skin: Negative for color change, pallor, rash and wound.  Hematological: Does not bruise/bleed easily.        Objective    BP (!) 161/82   Pulse 70   Temp 98.5 F (36.9 C)   Resp 16   Ht 6' (1.829 m)   Wt 290 lb (131.5 kg)   BMI 39.33 kg/m  BP Readings from Last 3 Encounters:  01/19/21 (!) 161/82  10/31/17 120/80  08/09/15 (!) 160/82   Wt Readings from Last 3 Encounters:  01/19/21 290 lb (131.5 kg)  10/31/17 246 lb (111.6 kg)  08/09/15 221 lb (100.2 kg)       Physical Exam    No results found for any visits on 01/19/21.  Assessment & Plan     1. Meralgia paresthetica of right side Treat with naproxen and will work-up as indicated if this does not improve.  2. Essential (primary) hypertension Get blood pressure cuff for  home use.  Follow-up later this summer.  3. Class 2 severe obesity due to excess calories with serious comorbidity and body mass index (BMI) of 39.0 to 39.9 in adult [facility]) With elevated blood pressure and hyperlipidemia  4. Pure hypercholesterolemia Patient has stopped his statin.  Follow-up on next lab draw.   No follow-ups on file.      I, Megan Mans, MD, have reviewed all documentation for this visit. The documentation on 01/19/21 for the exam, diagnosis, procedures, and orders are all accurate and complete.    Nik Gorrell Wendelyn Breslow, MD  [facility] [phone] (phone) [phone] (fax)  [facility]

## 2021-06-29 ENCOUNTER — Other Ambulatory Visit: Payer: Self-pay

## 2021-06-29 ENCOUNTER — Emergency Department
Admission: EM | Admit: 2021-06-29 | Discharge: 2021-06-29 | Disposition: A | Discharge status: Home / Self Care | Payer: Medicare Other | Attending: Emergency Medicine | Admitting: Emergency Medicine

## 2021-06-29 ENCOUNTER — Emergency Department: Payer: Medicare Other

## 2021-06-29 DIAGNOSIS — Z87891 Personal history of nicotine dependence: Secondary | ICD-10-CM | POA: Diagnosis not present

## 2021-06-29 DIAGNOSIS — M545 Low back pain, unspecified: Secondary | ICD-10-CM

## 2021-06-29 DIAGNOSIS — Z79899 Other long term (current) drug therapy: Secondary | ICD-10-CM | POA: Diagnosis not present

## 2021-06-29 DIAGNOSIS — I10 Essential (primary) hypertension: Secondary | ICD-10-CM | POA: Diagnosis not present

## 2021-06-29 HISTORY — DX: Essential (primary) hypertension: I10

## 2021-06-29 LAB — BASIC METABOLIC PANEL
Anion gap: 8 (ref 5–15)
BUN: 15 mg/dL (ref 8–23)
CO2: 24 mmol/L (ref 22–32)
Calcium: 8.9 mg/dL (ref 8.9–10.3)
Chloride: 106 mmol/L (ref 98–111)
Creatinine, Ser: 1.04 mg/dL (ref 0.61–1.24)
GFR, Estimated: >60 mL/min (ref >60)
Glucose, Bld: 96 mg/dL (ref 70–99)
Potassium: 4.3 mmol/L (ref 3.5–5.1)
Sodium: 138 mmol/L (ref 135–145)

## 2021-06-29 LAB — CBC
HCT: 42.9 % (ref 39.0–52.0)
Hemoglobin: 14.7 g/dL (ref 13.0–17.0)
MCH: 28.4 pg (ref 26.0–34.0)
MCHC: 34.3 g/dL (ref 30.0–36.0)
MCV: 83 fL (ref 80.0–100.0)
Platelets: 167 10*3/uL (ref 150–400)
RBC: 5.17 MIL/uL (ref 4.22–5.81)
RDW: 14 % (ref 11.5–15.5)
WBC: 6 10*3/uL (ref 4.0–10.5)
nRBC: 0 % (ref 0.0–0.2)

## 2021-06-29 LAB — TROPONIN I (HIGH SENSITIVITY)
Troponin I (High Sensitivity): 8 ng/L (ref <18)
Troponin I (High Sensitivity): 8 ng/L (ref <18)

## 2021-06-29 MED ORDER — LIDOCAINE 5 % EX PTCH
1.0000 | MEDICATED_PATCH | CUTANEOUS | Status: DC
  Administered 2021-06-29: 1 via TRANSDERMAL
  Filled 2021-06-29: qty 1

## 2021-06-29 MED ORDER — NAPROXEN 375 MG PO TABS
375.0000 mg | ORAL_TABLET | Freq: Once | ORAL | Status: DC
  Filled 2021-06-29: qty 1

## 2021-06-29 MED ORDER — ACETAMINOPHEN 500 MG PO TABS
1000.0000 mg | ORAL_TABLET | Freq: Once | ORAL | Status: AC
  Administered 2021-06-29: 1000 mg via ORAL
  Filled 2021-06-29: qty 2

## 2021-06-29 MED ORDER — NAPROXEN 375 MG PO TABS: 375.0000 mg | ORAL_TABLET | Freq: Two times a day (BID) | ORAL | 0 refills | Status: AC

## 2021-06-29 MED ORDER — LIDOCAINE 5 % EX PTCH: 1.0000 | MEDICATED_PATCH | CUTANEOUS | 0 refills | Status: DC

## 2021-06-29 NOTE — ED Provider Notes (Signed)
[facility]  ____________________________________________   Event Date/Time   First MD Initiated Contact with Patient 06/29/21 1617     (approximate)  I have reviewed the triage vital signs and the nursing notes.   HISTORY  Chief Complaint Back Pain    HPI Jonathon Bell is a 70 y.o. male with past medical history of hypertension who presents with back pain.  Patient went to [facility] clinic today for evaluation of back pain was sent to the emergency department due to his elevated blood pressure.  Pain in his back started about a week ago.  It is left-sided, does not radiate.  Is not associated with any numbness, weakness, perianal anesthesia, difficulty urinating or stooling.  He denies fevers or chills.  Denies any injury.  Has had similar pain in the past but not for many years.  Has taken naproxen for it.  He denies chest pain dyspnea.  He has been diagnosed with hypertension in the past but does not take any medications currently for it.         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 Authorizing Provider  lidocaine (LIDODERM) 5 % Place 1 patch onto the skin daily. Remove & Discard patch within 12 hours or as directed by MD 06/29/21  Yes Georga Hacking, MD  naproxen (NAPROSYN) 375 MG tablet Take 1 tablet (375 mg total) by mouth 2 (two) times daily with a meal. 06/29/21 07/29/21 Yes  Georga Hacking, MD  atorvastatin (LIPITOR) 10 MG tablet Take 1 tablet (10 mg total) by mouth daily. Patient not taking: No sig reported 08/11/15   Maple Hudson., MD  naproxen (NAPROSYN) 500 MG tablet 2 times daily as needed 01/19/21   Maple Hudson., MD    Allergies Patient has no known allergies.  Family History  Problem Relation Age of Onset   Heart disease Mother    Hypertension Mother    Lung cancer Father    Diabetes Sister    Heart disease Sister    Cirrhosis Sister        non alcoholic    Social History Social History   Tobacco Use   Smoking status: Former    Packs/day: 0.50    Years: 6.00    Pack years: 3.00    Types: Cigarettes   Smokeless tobacco: Former    Quit date: 10/31/1979  Substance Use Topics   Alcohol use: No    Alcohol/week: 0.0 standard drinks   Drug use: No    Review of Systems   Review of Systems  Constitutional:  Negative for chills and fever.  Respiratory:  Negative for shortness of breath.   Cardiovascular:  Negative for chest pain.  Musculoskeletal:  Positive for arthralgias, back pain and myalgias.  Neurological:  Negative for weakness, numbness and headaches.  All other systems reviewed and are negative.  Physical Exam  Updated Vital Signs BP (!) 168/111   Pulse 64   Temp 97.9 F (36.6 C) (Oral)   Resp 18   Ht 6' (1.829 m)   Wt 127 kg   SpO2 100%   BMI 37.97 kg/m   Physical Exam Vitals and nursing note reviewed.  Constitutional:      General: He is not in acute distress.    Appearance: Normal appearance.  HENT:     Head: Normocephalic and atraumatic.  Eyes:     General: No scleral icterus.    Conjunctiva/sclera: Conjunctivae normal.  Pulmonary:     Effort: Pulmonary effort is normal. No respiratory distress.     Breath sounds: Normal breath sounds. No wheezing.  Musculoskeletal:        General: No deformity or signs of injury.     Cervical back: Normal range of motion.     Comments: Left-sided SI  joint tenderness, no midline lumbar tenderness 5 out of 5 strength with knee extension, dorsiflexion, plantarflexion bilaterally Sensation grossly intact in the bilateral lower extremities  Skin:    Coloration: Skin is not jaundiced or pale.  Neurological:     General: No focal deficit present.     Mental Status: He is alert and oriented to person, place, and time. Mental status is at baseline.  Psychiatric:        Mood and Affect: Mood normal.        Behavior: Behavior normal.     LABS (all labs ordered are listed, but only abnormal results are displayed)  Labs Reviewed  BASIC METABOLIC PANEL  CBC  TROPONIN I (HIGH SENSITIVITY)  TROPONIN I (HIGH SENSITIVITY)   ____________________________________________  EKG  Normal sinus rhythm, normal axis, no acute ischemic changes ____________________________________________  RADIOLOGY Ky Barban, personally viewed and evaluated these images (plain radiographs) as part of my medical decision making, as well as reviewing the written report by the radiologist.  ED MD interpretation: Reviewed the chest x-ray which does not show any acute cardiopulmonary process    ____________________________________________   PROCEDURES  Procedure(s) performed (including Critical Care):  Procedures   ____________________________________________   INITIAL IMPRESSION / ASSESSMENT AND PLAN / ED COURSE     The patient is a 70 year old male with hypertension who presents with back pain.  His back pain is unilateral, without red flag symptoms of numbness, weakness saddle anesthesia or bowel bladder involvement.  He has no midline tenderness on exam and strength is normal.  Suspect lumbar sprain, low suspicion for cauda equina or spinal epidural abscess.  Work-up was done in triage given his elevated blood pressure however he is essentially asymptomatic hypertension.  Labs EKG and chest x-ray are reassuring.  Patient discharged with  naproxen and Lidoderm patch.  Advised that he follow-up with his primary care provider for management of his hypertension.      ____________________________________________   FINAL CLINICAL IMPRESSION(S) / ED DIAGNOSES  Final diagnoses:  Acute left-sided low back pain without sciatica  Hypertension, unspecified type     ED Discharge Orders          Ordered    naproxen (NAPROSYN) 375 MG tablet  2 times daily with meals        06/29/21 1701    lidocaine (LIDODERM) 5 %  Every 24 hours        06/29/21 1701             Note:  This document was prepared using Dragon voice recognition software and may  include unintentional dictation errors.    Georga Hacking, MD 06/29/21 [phone]

## 2021-06-29 NOTE — ED Notes (Signed)
Patient stable and discharged with all personal belongings and AVS. AVS and discharge instructions reviewed with patient and opportunity for questions provided.   

## 2021-06-29 NOTE — ED Triage Notes (Signed)
Pt to ED from [facility] for lower back pain and HTN. Reports back pain x1 week, no injury, denies other sx Reports hx of HTN, does not take meds. Denies chest pain, shob, headache Reports taking naproxen with no relief

## 2021-07-18 NOTE — Progress Notes (Signed)
I,April Miller,acting as a scribe for Megan Mans, MD.,have documented all relevant documentation on the behalf of Megan Mans, MD,as directed by  Megan Mans, MD while in the presence of Megan Mans, MD.   Annual Wellness Visit     Patient: Jonathon Bell, Male    DOB: 03/29/1951, 70 y.o.   MRN: [id number] Visit Date: 07/19/2021  Today's Provider: Megan Mans, MD   Chief Complaint  Patient presents with   Medicare Wellness   Subjective    Jonathon Bell is a 70 y.o. male who presents today for his Annual Wellness Visit. He reports consuming a general diet. The patient does not participate in regular exercise at present. He generally feels well. He reports sleeping well. He does not have additional problems to discuss today.   HPI Medical issues are stable he needs bilateral total knee replacement.  He is in need of colonoscopy. Medications: Outpatient Medications Prior to Visit  Medication Sig   lidocaine (LIDODERM) 5 % Place 1 patch onto the skin daily. Remove & Discard patch within 12 hours or as directed by MD   naproxen (NAPROSYN) 375 MG tablet Take 1 tablet (375 mg total) by mouth 2 (two) times daily with a meal.   naproxen (NAPROSYN) 500 MG tablet 2 times daily as needed   atorvastatin (LIPITOR) 10 MG tablet Take 1 tablet (10 mg total) by mouth daily. (Patient not taking: No sig reported)   No facility-administered medications prior to visit.    No Known Allergies  Patient Care Team: Maple Hudson., MD as PCP - General (Family Medicine)  Review of Systems  HENT:  Positive for tinnitus.   Respiratory:  Positive for shortness of breath.   Musculoskeletal:  Positive for arthralgias.  Allergic/Immunologic: Positive for food allergies.  All other systems reviewed and are negative.       Objective    Vitals: BP (!) 178/101 (BP Location: Left Arm, Patient Position: Sitting, Cuff Size: Large)   Pulse 77   Temp 97.9 F  (36.6 C) (Oral)   Resp 18   Ht 6' (1.829 m)   Wt 280 lb (127 kg)   SpO2 98%   BMI 37.97 kg/m     Physical Exam Vitals reviewed.  Constitutional:      Appearance: He is well-developed. He is obese.  HENT:     Head: Normocephalic and atraumatic.     Right Ear: External ear normal.     Left Ear: External ear normal.     Nose: Nose normal.  Eyes:     General: No scleral icterus.    Conjunctiva/sclera: Conjunctivae normal.  Neck:     Thyroid: No thyromegaly.  Cardiovascular:     Rate and Rhythm: Normal rate and regular rhythm.     Heart sounds: Normal heart sounds.  Pulmonary:     Effort: Pulmonary effort is normal.     Breath sounds: Normal breath sounds.  Abdominal:     Palpations: Abdomen is soft.  Genitourinary:    Penis: Normal.   Musculoskeletal:        General: Normal range of motion.  Lymphadenopathy:     Cervical: No cervical adenopathy.  Skin:    General: Skin is warm and dry.  Neurological:     General: No focal deficit present.     Mental Status: He is alert and oriented to person, place, and time.  Psychiatric:        Mood and  Affect: Mood normal.        Behavior: Behavior normal.        Thought Content: Thought content normal.        Judgment: Judgment normal.     Most recent functional status assessment: In your present state of health, do you have any difficulty performing the following activities: 07/19/2021  Hearing? Y  Vision? N  Difficulty concentrating or making decisions? N  Walking or climbing stairs? Y  Dressing or bathing? N  Doing errands, shopping? N  Some recent data might be hidden   Most recent fall risk assessment: Fall Risk  07/19/2021  Falls in the past year? 0  Number falls in past yr: 0  Injury with Fall? 0  Comment -  Risk for fall due to : No Fall Risks  Follow up Falls evaluation completed    Most recent depression screenings: PHQ 2/9 Scores 07/19/2021 10/31/2017  PHQ - 2 Score 0 1  PHQ- 9 Score 2 3   Most recent  cognitive screening: No flowsheet data found. Most recent Audit-C alcohol use screening Alcohol Use Disorder Test (AUDIT) 07/19/2021  1. How often do you have a drink containing alcohol? 1  2. How many drinks containing alcohol do you have on a typical day when you are drinking? 0  3. How often do you have six or more drinks on one occasion? 0  AUDIT-C Score 1   A score of 3 or more in women, and 4 or more in men indicates increased risk for alcohol abuse, EXCEPT if all of the points are from question 1   No results found for any visits on 07/19/21.  Assessment & Plan     Annual wellness visit done today including the all of the following: Reviewed patient's Family Medical History Reviewed and updated list of patient's medical providers Assessment of cognitive impairment was done Assessed patient's functional ability Established a written schedule for health screening services Health Risk Assessent Completed and Reviewed  Exercise Activities and Dietary recommendations  Goals   None     Immunization History  Administered Date(s) Administered   Td 08/11/2004   Tdap 05/26/2014   Zoster, Live 05/26/2014    Health Maintenance  Topic Date Due   Hepatitis C Screening  Never done   Zoster Vaccines- Shingrix (1 of 2) Never done   COLONOSCOPY (Pts 45-30yrs Insurance coverage will need to be confirmed)  09/10/2014   INFLUENZA VACCINE  06/28/2022 (Originally 05/30/2021)   COVID-19 Vaccine (1) 08/04/2022 (Originally 04/17/1952)   TETANUS/TDAP  05/26/2024   HPV VACCINES  Aged Out     Discussed health benefits of physical activity, and encouraged him to engage in regular exercise appropriate for his age and condition.    1. Encounter for Medicare annual wellness exam  - CBC with Differential/Platelet - Comprehensive metabolic panel - Lipid panel - TSH  2. Essential (primary) hypertension On amlodipine 5 mg daily and follow-up in a few months. - CBC with  Differential/Platelet - Comprehensive metabolic panel - Lipid panel - TSH - amLODipine (NORVASC) 5 MG tablet; Take 1 tablet (5 mg total) by mouth daily.  Dispense: 30 tablet; Refill: 5  3. Pure hypercholesterolemia  - CBC with Differential/Platelet - Comprehensive metabolic panel - Lipid panel - TSH  4. Class 2 severe obesity due to excess calories with serious comorbidity and body mass index (BMI) of 39.0 to 39.9 in adult [facility]) Diet and exercise stressed. - CBC with Differential/Platelet - Comprehensive metabolic panel - Lipid  panel - TSH  5. Colon cancer screening  - Ambulatory referral to Gastroenterology 6.  Varicose veins 7.  Bilateral knee osteoarthritis Return in about 4 months (around 11/23/2021).     I, Megan Mans, MD, have reviewed all documentation for this visit. The documentation on 07/23/21 for the exam, diagnosis, procedures, and orders are all accurate and complete.    Yatzil Clippinger Wendelyn Breslow, MD  [facility] [phone] (phone) [phone] (fax)  [facility]

## 2021-07-19 ENCOUNTER — Ambulatory Visit (INDEPENDENT_AMBULATORY_CARE_PROVIDER_SITE_OTHER): Payer: Medicare Other | Admitting: Family Medicine

## 2021-07-19 ENCOUNTER — Other Ambulatory Visit: Payer: Self-pay

## 2021-07-19 ENCOUNTER — Encounter: Payer: Self-pay | Admitting: Family Medicine

## 2021-07-19 VITALS — BP 178/101 | HR 77 | Temp 97.9°F | Resp 18 | Ht 72.0 in | Wt 280.0 lb

## 2021-07-19 DIAGNOSIS — M170 Bilateral primary osteoarthritis of knee: Secondary | ICD-10-CM

## 2021-07-19 DIAGNOSIS — I8393 Asymptomatic varicose veins of bilateral lower extremities: Secondary | ICD-10-CM

## 2021-07-19 DIAGNOSIS — E7800 Pure hypercholesterolemia, unspecified: Secondary | ICD-10-CM

## 2021-07-19 DIAGNOSIS — Z1211 Encounter for screening for malignant neoplasm of colon: Secondary | ICD-10-CM

## 2021-07-19 DIAGNOSIS — Z0000 Encounter for general adult medical examination without abnormal findings: Secondary | ICD-10-CM

## 2021-07-19 DIAGNOSIS — I10 Essential (primary) hypertension: Secondary | ICD-10-CM

## 2021-07-19 DIAGNOSIS — Z6839 Body mass index (BMI) 39.0-39.9, adult: Secondary | ICD-10-CM

## 2021-07-19 MED ORDER — AMLODIPINE BESYLATE 5 MG PO TABS: 5.0000 mg | ORAL_TABLET | Freq: Every day | ORAL | 5 refills | Status: DC

## 2021-07-19 MED ORDER — NA SULFATE-K SULFATE-MG SULF 17.5-3.13-1.6 GM/177ML PO SOLN: 1.0000 | Freq: Once | ORAL | 0 refills | Status: AC

## 2021-07-19 NOTE — Progress Notes (Signed)
Gastroenterology Pre-Procedure Review  Request Date: 08/08/21 Requesting Physician: Dr. Allegra Lai  PATIENT REVIEW QUESTIONS: The patient responded to the following health history questions as indicated:    1. Are you having any GI issues? no 2. Do you have a personal history of Polyps? no 3. Do you have a family history of Colon Cancer or Polyps? no 4. Diabetes Mellitus? no 5. Joint replacements in the past 12 months?no 6. Major health problems in the past 3 months?no 7. Any artificial heart valves, MVP, or defibrillator?no    MEDICATIONS & ALLERGIES:    Patient reports the following regarding taking any anticoagulation/antiplatelet therapy:   Plavix, Coumadin, Eliquis, Xarelto, Lovenox, Pradaxa, Brilinta, or Effient? no Aspirin? no  Patient confirms/reports the following medications:  Current Outpatient Medications  Medication Sig Dispense Refill   amLODipine (NORVASC) 5 MG tablet Take 1 tablet (5 mg total) by mouth daily. 30 tablet 5   atorvastatin (LIPITOR) 10 MG tablet Take 1 tablet (10 mg total) by mouth daily. (Patient not taking: No sig reported) 30 tablet 12   lidocaine (LIDODERM) 5 % Place 1 patch onto the skin daily. Remove & Discard patch within 12 hours or as directed by MD 30 patch 0   naproxen (NAPROSYN) 375 MG tablet Take 1 tablet (375 mg total) by mouth 2 (two) times daily with a meal. 60 tablet 0   naproxen (NAPROSYN) 500 MG tablet 2 times daily as needed 60 tablet 0   No current facility-administered medications for this visit.    Patient confirms/reports the following allergies:  No Known Allergies  No orders of the defined types were placed in this encounter.   AUTHORIZATION INFORMATION Primary Insurance: 1D#: Group #:  Secondary Insurance: 1D#: Group #:  SCHEDULE INFORMATION: Date: 08/08/21 Time: Location: [facility]

## 2021-07-20 LAB — CBC WITH DIFFERENTIAL/PLATELET
Basophils Absolute: 0 10*3/uL (ref 0.0–0.2)
Basos: 0 %
EOS (ABSOLUTE): 0.1 10*3/uL (ref 0.0–0.4)
Eos: 2 %
Hematocrit: 45.2 % (ref 37.5–51.0)
Hemoglobin: 15.5 g/dL (ref 13.0–17.7)
Immature Grans (Abs): 0 10*3/uL (ref 0.0–0.1)
Immature Granulocytes: 0 %
Lymphocytes Absolute: 1.3 10*3/uL (ref 0.7–3.1)
Lymphs: 25 %
MCH: 27.5 pg (ref 26.6–33.0)
MCHC: 34.3 g/dL (ref 31.5–35.7)
MCV: 80 fL (ref 79–97)
Monocytes Absolute: 0.4 10*3/uL (ref 0.1–0.9)
Monocytes: 8 %
Neutrophils Absolute: 3.4 10*3/uL (ref 1.4–7.0)
Neutrophils: 65 %
Platelets: 212 10*3/uL (ref 150–450)
RBC: 5.64 x10E6/uL (ref 4.14–5.80)
RDW: 14.2 % (ref 11.6–15.4)
WBC: 5.2 10*3/uL (ref 3.4–10.8)

## 2021-07-20 LAB — COMPREHENSIVE METABOLIC PANEL
ALT: 28 IU/L (ref 0–44)
AST: 24 IU/L (ref 0–40)
Albumin/Globulin Ratio: 2 (ref 1.2–2.2)
Albumin: 4.8 g/dL (ref 3.8–4.8)
Alkaline Phosphatase: 71 IU/L (ref 44–121)
BUN/Creatinine Ratio: 15 (ref 10–24)
BUN: 15 mg/dL (ref 8–27)
Bilirubin Total: 0.5 mg/dL (ref 0.0–1.2)
CO2: 23 mmol/L (ref 20–29)
Calcium: 10.2 mg/dL (ref 8.6–10.2)
Chloride: 103 mmol/L (ref 96–106)
Creatinine, Ser: 1.03 mg/dL (ref 0.76–1.27)
Globulin, Total: 2.4 g/dL (ref 1.5–4.5)
Glucose: 101 mg/dL — ABNORMAL HIGH (ref 65–99)
Potassium: 4.9 mmol/L (ref 3.5–5.2)
Sodium: 142 mmol/L (ref 134–144)
Total Protein: 7.2 g/dL (ref 6.0–8.5)
eGFR: 79 mL/min/{1.73_m2} (ref >59)

## 2021-07-20 LAB — LIPID PANEL
Chol/HDL Ratio: 6.8 ratio — ABNORMAL HIGH (ref 0.0–5.0)
Cholesterol, Total: 244 mg/dL — ABNORMAL HIGH (ref 100–199)
HDL: 36 mg/dL — ABNORMAL LOW (ref >39)
LDL Chol Calc (NIH): 187 mg/dL — ABNORMAL HIGH (ref 0–99)
Triglycerides: 116 mg/dL (ref 0–149)
VLDL Cholesterol Cal: 21 mg/dL (ref 5–40)

## 2021-07-20 LAB — TSH: TSH: 2.07 u[IU]/mL (ref 0.450–4.500)

## 2021-08-08 ENCOUNTER — Ambulatory Visit: Payer: Medicare Other | Admitting: Anesthesiology

## 2021-08-08 ENCOUNTER — Ambulatory Visit
Admission: RE | Admit: 2021-08-08 | Discharge: 2021-08-08 | Disposition: A | Discharge status: Home / Self Care | Payer: Medicare Other | Attending: Gastroenterology | Admitting: Gastroenterology

## 2021-08-08 ENCOUNTER — Encounter
Admission: RE | Disposition: A | Discharge status: Home / Self Care | Payer: Self-pay | Source: Home / Self Care | Attending: Gastroenterology

## 2021-08-08 DIAGNOSIS — Z79899 Other long term (current) drug therapy: Secondary | ICD-10-CM | POA: Diagnosis not present

## 2021-08-08 DIAGNOSIS — Z87891 Personal history of nicotine dependence: Secondary | ICD-10-CM | POA: Insufficient documentation

## 2021-08-08 DIAGNOSIS — I10 Essential (primary) hypertension: Secondary | ICD-10-CM | POA: Diagnosis not present

## 2021-08-08 DIAGNOSIS — K5730 Diverticulosis of large intestine without perforation or abscess without bleeding: Secondary | ICD-10-CM | POA: Diagnosis not present

## 2021-08-08 DIAGNOSIS — K644 Residual hemorrhoidal skin tags: Secondary | ICD-10-CM | POA: Diagnosis not present

## 2021-08-08 DIAGNOSIS — K635 Polyp of colon: Secondary | ICD-10-CM | POA: Insufficient documentation

## 2021-08-08 DIAGNOSIS — Z1211 Encounter for screening for malignant neoplasm of colon: Secondary | ICD-10-CM

## 2021-08-08 HISTORY — PX: COLONOSCOPY WITH PROPOFOL: SHX5780

## 2021-08-08 SURGERY — COLONOSCOPY WITH PROPOFOL
Anesthesia: Monitor Anesthesia Care

## 2021-08-08 MED ORDER — EPHEDRINE 5 MG/ML INJ
INTRAVENOUS | Status: AC
  Filled 2021-08-08: qty 5

## 2021-08-08 MED ORDER — SODIUM CHLORIDE 0.9 % IV SOLN
INTRAVENOUS | Status: DC
  Administered 2021-08-08: 20 mL/h via INTRAVENOUS

## 2021-08-08 MED ORDER — PROPOFOL 10 MG/ML IV BOLUS
INTRAVENOUS | Status: DC | PRN
  Administered 2021-08-08 (×2): 50 mg via INTRAVENOUS
  Administered 2021-08-08: 40 mg via INTRAVENOUS
  Administered 2021-08-08: 60 mg via INTRAVENOUS
  Administered 2021-08-08: 40 mg via INTRAVENOUS
  Administered 2021-08-08: 100 mg via INTRAVENOUS
  Administered 2021-08-08: 40 mg via INTRAVENOUS

## 2021-08-08 MED ORDER — PROPOFOL 500 MG/50ML IV EMUL
INTRAVENOUS | Status: AC
  Filled 2021-08-08: qty 50

## 2021-08-08 MED ORDER — ESMOLOL HCL 100 MG/10ML IV SOLN
INTRAVENOUS | Status: AC
  Filled 2021-08-08: qty 10

## 2021-08-08 NOTE — Anesthesia Postprocedure Evaluation (Signed)
Anesthesia Post Note  Patient: Jonathon Bell  Procedure(s) Performed: COLONOSCOPY WITH PROPOFOL  Patient location during evaluation: Endoscopy Anesthesia Type: MAC Level of consciousness: awake and alert Pain management: pain level controlled Vital Signs Assessment: post-procedure vital signs reviewed and stable Respiratory status: spontaneous breathing, nonlabored ventilation, respiratory function stable and patient connected to nasal cannula oxygen Cardiovascular status: stable and blood pressure returned to baseline Postop Assessment: no apparent nausea or vomiting Anesthetic complications: no   No notable events documented.   Last Vitals:  Vitals:   08/08/21 0950 08/08/21 1000  BP: 135/78 135/87  Pulse: 61 71  Resp: 13 14  Temp:    SpO2: 99% 98%    Last Pain:  Vitals:   08/08/21 0845  TempSrc: Temporal  PainSc: 0-No pain                 [name]

## 2021-08-08 NOTE — Transfer of Care (Signed)
Immediate Anesthesia Transfer of Care Note  Patient: Jonathon Bell  Procedure(s) Performed: COLONOSCOPY WITH PROPOFOL  Patient Location: Endoscopy Unit  Anesthesia Type:MAC  Level of Consciousness: drowsy  Airway & Oxygen Therapy: Patient Spontanous Breathing and Patient connected to nasal cannula oxygen  Post-op Assessment: Report given to RN and Patient moving all extremities  Post vital signs: Reviewed and stable  Last Vitals:  Vitals Value Taken Time  BP 127/83 08/08/21 0931  Temp 35.9 C 08/08/21 0930  Pulse 80 08/08/21 0932  Resp 19 08/08/21 0932  SpO2 96 % 08/08/21 0932  Vitals shown include unvalidated device data.  Last Pain:  Vitals:   08/08/21 0845  TempSrc: Temporal  PainSc: 0-No pain         Complications: No notable events documented.

## 2021-08-08 NOTE — H&P (Signed)
Jonathon Repress, MD [address]  [address]  [place], [place] [address]  Main: [phone]  Fax: [phone] Pager: [phone]  Primary Care Physician:  Jonathon Bell., MD Primary Gastroenterologist:  Dr. Arlyss Bell  Pre-Procedure History & Physical: HPI:  Jonathon Bell is a 70 y.o. male is here for an colonoscopy.   Past Medical History:  Diagnosis Date   Hypertension     Past Surgical History:  Procedure Laterality Date   FRACTURE SURGERY     arm requiring surgery on tendons    Prior to Admission medications   Medication Sig Start Date End Date Taking? Authorizing Provider  amLODipine (NORVASC) 5 MG tablet Take 1 tablet (5 mg total) by mouth daily. 07/19/21  Yes Jonathon Bell., MD  lidocaine (LIDODERM) 5 % Place 1 patch onto the skin daily. Remove & Discard patch within 12 hours or as directed by MD 06/29/21  Yes Jonathon Hacking, MD  naproxen (NAPROSYN) 500 MG tablet 2 times daily as needed 01/19/21  Yes Jonathon Bell., MD  atorvastatin (LIPITOR) 10 MG tablet Take 1 tablet (10 mg total) by mouth daily. Patient not taking: No sig reported 08/11/15   Jonathon Bell., MD    Allergies as of 07/19/2021   (No Known Allergies)    Family History  Problem Relation Age of Onset   Heart disease Mother    Hypertension Mother    Lung cancer Father    Diabetes Sister    Heart disease Sister    Cirrhosis Sister        non alcoholic    Social History   Socioeconomic History   Marital status: Divorced    Spouse name: Not on file   Number of children: Not on file   Years of education: Not on file   Highest education level: Not on file  Occupational History   Not on file  Tobacco Use   Smoking status: Former    Packs/day: 0.50    Years: 6.00    Pack years: 3.00    Types: Cigarettes   Smokeless tobacco: Former    Quit date: 10/31/1979  Substance and Sexual Activity   Alcohol use: No    Alcohol/week: 0.0 standard drinks    Drug use: No   Sexual activity: Not on file  Other Topics Concern   Not on file  Social History Narrative   Not on file   Social Determinants of Health   Financial Resource Strain: Not on file  Food Insecurity: Not on file  Transportation Needs: Not on file  Physical Activity: Not on file  Stress: Not on file  Social Connections: Not on file  Intimate Partner Violence: Not on file    Review of Systems: See HPI, otherwise negative ROS  Physical Exam: BP (!) 172/113   Pulse 67   Temp (!) 96.7 F (35.9 C) (Temporal)   Resp 20   Ht 6' (1.829 m)   Wt 124.7 kg   SpO2 98%   BMI 37.30 kg/m  General:   Alert,  pleasant and cooperative in NAD Head:  Normocephalic and atraumatic. Neck:  Supple; no masses or thyromegaly. Lungs:  Clear throughout to auscultation.    Heart:  Regular rate and rhythm. Abdomen:  Soft, nontender and nondistended. Normal bowel sounds, without guarding, and without rebound.   Neurologic:  Alert and  oriented x4;  grossly normal neurologically.  Impression/Plan: Jonathon Bell is here for an colonoscopy  to be performed for colon cancer screening  Risks, benefits, limitations, and alternatives regarding  colonoscopy have been reviewed with the patient.  Questions have been answered.  All parties agreeable.   Lannette Donath, MD  08/08/2021, 8:50 AM

## 2021-08-08 NOTE — Anesthesia Preprocedure Evaluation (Signed)
Anesthesia Evaluation  Patient identified by MRN, date of birth, ID band Patient awake    Reviewed: Allergy & Precautions, NPO status , Patient's Chart, lab work & pertinent test results  History of Anesthesia Complications Negative for: history of anesthetic complications  Airway Mallampati: II  TM Distance: >3 FB Neck ROM: Full    Dental no notable dental hx. (+) Teeth Intact   Pulmonary neg pulmonary ROS, former smoker,    Pulmonary exam normal breath sounds clear to auscultation       Cardiovascular Exercise Tolerance: Good METS: 3 - Mets hypertension, negative cardio ROS Normal cardiovascular exam Rhythm:Regular Rate:Normal     Neuro/Psych negative neurological ROS  negative psych ROS   GI/Hepatic Neg liver ROS, hiatal hernia,   Endo/Other  negative endocrine ROS  Renal/GU negative Renal ROS  negative genitourinary   Musculoskeletal negative musculoskeletal ROS (+)   Abdominal   Peds  Hematology negative hematology ROS (+)   Anesthesia Other Findings   Reproductive/Obstetrics negative OB ROS                             Anesthesia Physical Anesthesia Plan  ASA: 2  Anesthesia Plan: MAC   Post-op Pain Management:    Induction: Intravenous  PONV Risk Score and Plan:   Airway Management Planned: Natural Airway and Nasal Cannula  Additional Equipment:   Intra-op Plan:   Post-operative Plan:   Informed Consent: I have reviewed the patients History and Physical, chart, labs and discussed the procedure including the risks, benefits and alternatives for the proposed anesthesia with the patient or authorized representative who has indicated his/her understanding and acceptance.     Dental Advisory Given  Plan Discussed with: Anesthesiologist, CRNA and Surgeon  Anesthesia Plan Comments: (Patient consented for risks of anesthesia including but not limited to:  - adverse  reactions to medications - damage to eyes, teeth, lips or other oral mucosa - nerve damage due to positioning  - sore throat or hoarseness - Damage to heart, brain, nerves, lungs, other parts of body or loss of life  Patient voiced understanding.)        Anesthesia Quick Evaluation

## 2021-08-08 NOTE — Op Note (Signed)
[facility] Gastroenterology Patient Name: Erick Murin Procedure Date: 08/08/2021 8:48 AM MRN: [id number] Account #: [id number] Date of Birth: 30-Mar-1951 Admit Type: Outpatient Age: 71 Room: [facility] ENDO ROOM 1 Gender: Male Note Status: Finalized Instrument Name: Colonscope [id number] Procedure:             Colonoscopy Indications:           Screening for colorectal malignant neoplasm, Last                         colonoscopy: November 2005, Last colonoscopy 10 years                         ago Providers:             Toney Reil MD, MD Referring MD:          Ferdinand Lango. Sullivan Lone, MD (Referring MD) Medicines:             General Anesthesia Complications:         No immediate complications. Estimated blood loss: None. Procedure:             Pre-Anesthesia Assessment:                        - Prior to the procedure, a History and Physical was                         performed, and patient medications and allergies were                         reviewed. The patient is competent. The risks and                         benefits of the procedure and the sedation options and                         risks were discussed with the patient. All questions                         were answered and informed consent was obtained.                         Patient identification and proposed procedure were                         verified by the physician, the nurse, the                         anesthesiologist, the anesthetist and the technician                         in the pre-procedure area in the procedure room in the                         endoscopy suite. Mental Status Examination: alert and                         oriented. Airway Examination: normal oropharyngeal  airway and neck mobility. Respiratory Examination:                         clear to auscultation. CV Examination: normal.                         Prophylactic Antibiotics: The patient  does not require                         prophylactic antibiotics. Prior Anticoagulants: The                         patient has taken no previous anticoagulant or                         antiplatelet agents. ASA Grade Assessment: III - A                         patient with severe systemic disease. After reviewing                         the risks and benefits, the patient was deemed in                         satisfactory condition to undergo the procedure. The                         anesthesia plan was to use general anesthesia.                         Immediately prior to administration of medications,                         the patient was re-assessed for adequacy to receive                         sedatives. The heart rate, respiratory rate, oxygen                         saturations, blood pressure, adequacy of pulmonary                         ventilation, and response to care were monitored                         throughout the procedure. The physical status of the                         patient was re-assessed after the procedure.                        After obtaining informed consent, the colonoscope was                         passed under direct vision. Throughout the procedure,                         the patient's blood pressure, pulse, and oxygen  saturations were monitored continuously. The                         Colonoscope was introduced through the anus and                         advanced to the the cecum, identified by appendiceal                         orifice and ileocecal valve. The colonoscopy was                         performed with moderate difficulty due to significant                         looping and the patient's body habitus. Successful                         completion of the procedure was aided by applying                         abdominal pressure. The patient tolerated the                         procedure well. The  quality of the bowel preparation                         was evaluated using the BBPS [facility] Bowel Preparation                         Scale) with scores of: Right Colon = 3, Transverse                         Colon = 3 and Left Colon = 3 (entire mucosa seen well                         with no residual staining, small fragments of stool or                         opaque liquid). The total BBPS score equals 9. Findings:      The perianal and digital rectal examinations were normal. Pertinent       negatives include normal sphincter tone and no palpable rectal lesions.      Three sessile polyps were found in the rectum, descending colon and       ascending colon. The polyps were 4 to 5 mm in size. These polyps were       removed with a cold snare. Resection and retrieval were complete.       Estimated blood loss: none.      A few diverticula were found in the sigmoid colon.      Non-bleeding external hemorrhoids were found during retroflexion. The       hemorrhoids were medium-sized. Impression:            - Three 4 to 5 mm polyps in the rectum, in the                         descending  colon and in the ascending colon, removed                         with a cold snare. Resected and retrieved.                        - Diverticulosis in the sigmoid colon.                        - Non-bleeding external hemorrhoids. Recommendation:        - Discharge patient to home (with escort).                        - Resume previous diet today.                        - Continue present medications.                        - Await pathology results.                        - Repeat colonoscopy in 3 - 5 years for surveillance                         of multiple polyps. Procedure Code(s):     --- Professional ---                        [phone], Colonoscopy, flexible; with removal of                         tumor(s), polyp(s), or other lesion(s) by snare                         technique Diagnosis Code(s):      --- Professional ---                        Z12.11, Encounter for screening for malignant neoplasm                         of colon                        K62.1, Rectal polyp                        K63.5, Polyp of colon                        K64.4, Residual hemorrhoidal skin tags                        K57.30, Diverticulosis of large intestine without                         perforation or abscess without bleeding CPT copyright 2019 American Medical Association. All rights reserved. The codes documented in this report are preliminary and upon coder review may  be revised to meet current compliance requirements. Dr. Libby Maw Toney Reil MD, MD 08/08/2021 9:29:48 AM This report has been signed electronically. Number of Addenda:  0 Note Initiated On: 08/08/2021 8:48 AM Scope Withdrawal Time: 0 hours 16 minutes 26 seconds  Total Procedure Duration: 0 hours 19 minutes 58 seconds  Estimated Blood Loss:  Estimated blood loss: none.      [facility]

## 2021-08-09 ENCOUNTER — Encounter: Payer: Self-pay | Admitting: Gastroenterology

## 2021-08-09 LAB — SURGICAL PATHOLOGY

## 2021-11-23 ENCOUNTER — Encounter: Payer: Self-pay | Admitting: Family Medicine

## 2021-11-23 ENCOUNTER — Ambulatory Visit (INDEPENDENT_AMBULATORY_CARE_PROVIDER_SITE_OTHER): Payer: Medicare Other | Admitting: Family Medicine

## 2021-11-23 ENCOUNTER — Other Ambulatory Visit: Payer: Self-pay

## 2021-11-23 ENCOUNTER — Ambulatory Visit: Payer: Medicare Other | Admitting: Family Medicine

## 2021-11-23 VITALS — BP 158/92 | HR 61 | Temp 98.3°F | Resp 16 | Ht 72.0 in | Wt 279.0 lb

## 2021-11-23 DIAGNOSIS — M170 Bilateral primary osteoarthritis of knee: Secondary | ICD-10-CM

## 2021-11-23 DIAGNOSIS — E7800 Pure hypercholesterolemia, unspecified: Secondary | ICD-10-CM | POA: Diagnosis not present

## 2021-11-23 DIAGNOSIS — I10 Essential (primary) hypertension: Secondary | ICD-10-CM

## 2021-11-23 DIAGNOSIS — E668 Other obesity: Secondary | ICD-10-CM

## 2021-11-23 DIAGNOSIS — Z6839 Body mass index (BMI) 39.0-39.9, adult: Secondary | ICD-10-CM

## 2021-11-23 MED ORDER — AMLODIPINE BESYLATE 10 MG PO TABS: 10.0000 mg | ORAL_TABLET | Freq: Every day | ORAL | 0 refills | Status: DC

## 2021-11-23 NOTE — Progress Notes (Signed)
° ° °  SUBJECTIVE:   CHIEF COMPLAINT / HPI:   Hypertension: - Medications: amlodipine - Compliance: few missed doses - Checking BP at home: no - Denies any SOB, CP, LE edema, medication SEs, or symptoms of hypotension - Diet: see below - Exercise: see below  HLD - medications: lipitor - compliance: not taking.  - medication SEs: none - Diet: 24 hr recall: coffee, 4 steaks for lunch, pizza for dinner (3 slices). Yesterday was typical day.  - Exercise: none. Difficult due to knee OA, getting steroid injections.   OBJECTIVE:   BP (!) 158/92    Pulse 61    Temp 98.3 F (36.8 C) (Temporal)    Resp 16    Ht 6' (1.829 m)    Wt 279 lb (126.6 kg)    SpO2 99%    BMI 37.84 kg/m   Gen: well appearing, in NAD Card: RRR Lungs: CTAB Ext: WWP, trace edema   ASSESSMENT/PLAN:   Essential (primary) hypertension Uncontrolled. Elevated and on recheck. Will increase amlodipine. Obtaining labs. F/u in 3 months.  Extreme obesity Contributing to HTN, HLD. Counseled on diet, exercise.   HLD (hyperlipidemia) Not compliant with statin. Stressed importance of diet, exercise, weight loss if unwilling to take statins. Obtaining labs. F/u in 3 months.     Caro Laroche, DO

## 2021-11-23 NOTE — Assessment & Plan Note (Signed)
Contributing to HTN, HLD. Counseled on diet, exercise.

## 2021-11-23 NOTE — Patient Instructions (Signed)
It was great to see you!  Our plans for today:  - Take 10mg  of amlodipine. I sent a new prescription to the pharmacy.  - We are checking some labs today, we will release these results to your MyChart. - Come back in 3 months.  Take care and seek immediate care sooner if you develop any concerns.   Dr. 

## 2021-11-23 NOTE — Assessment & Plan Note (Signed)
Not compliant with statin. Stressed importance of diet, exercise, weight loss if unwilling to take statins. Obtaining labs. F/u in 3 months.

## 2021-11-23 NOTE — Assessment & Plan Note (Signed)
Uncontrolled. Elevated and on recheck. Will increase amlodipine. Obtaining labs. F/u in 3 months.

## 2021-11-24 LAB — BASIC METABOLIC PANEL
BUN/Creatinine Ratio: 16 (ref 10–24)
BUN: 18 mg/dL (ref 8–27)
CO2: 20 mmol/L (ref 20–29)
Calcium: 9.1 mg/dL (ref 8.6–10.2)
Chloride: 102 mmol/L (ref 96–106)
Creatinine, Ser: 1.1 mg/dL (ref 0.76–1.27)
Glucose: 87 mg/dL (ref 70–99)
Potassium: 4.5 mmol/L (ref 3.5–5.2)
Sodium: 139 mmol/L (ref 134–144)
eGFR: 73 mL/min/{1.73_m2} (ref >59)

## 2021-11-24 LAB — LIPID PANEL
Chol/HDL Ratio: 5.5 ratio — ABNORMAL HIGH (ref 0.0–5.0)
Cholesterol, Total: 254 mg/dL — ABNORMAL HIGH (ref 100–199)
HDL: 46 mg/dL (ref >39)
LDL Chol Calc (NIH): 193 mg/dL — ABNORMAL HIGH (ref 0–99)
Triglycerides: 89 mg/dL (ref 0–149)
VLDL Cholesterol Cal: 15 mg/dL (ref 5–40)

## 2022-02-01 ENCOUNTER — Ambulatory Visit: Payer: Medicare Other | Admitting: Family Medicine

## 2022-02-15 ENCOUNTER — Ambulatory Visit (INDEPENDENT_AMBULATORY_CARE_PROVIDER_SITE_OTHER): Payer: Medicare Other | Admitting: Family Medicine

## 2022-02-15 ENCOUNTER — Encounter: Payer: Self-pay | Admitting: Family Medicine

## 2022-02-15 VITALS — BP 159/88 | HR 60 | Resp 16 | Wt 277.0 lb

## 2022-02-15 DIAGNOSIS — M170 Bilateral primary osteoarthritis of knee: Secondary | ICD-10-CM | POA: Diagnosis not present

## 2022-02-15 DIAGNOSIS — E7800 Pure hypercholesterolemia, unspecified: Secondary | ICD-10-CM

## 2022-02-15 DIAGNOSIS — I10 Essential (primary) hypertension: Secondary | ICD-10-CM | POA: Diagnosis not present

## 2022-02-15 DIAGNOSIS — Z6837 Body mass index (BMI) 37.0-37.9, adult: Secondary | ICD-10-CM

## 2022-02-15 MED ORDER — TELMISARTAN 20 MG PO TABS: 20.0000 mg | ORAL_TABLET | Freq: Every day | ORAL | 11 refills | Status: DC

## 2022-02-15 NOTE — Progress Notes (Signed)
?  ? ? ?Established patient visit ? ?I,April Miller,acting as a scribe for Wilhemena Durie, MD.,have documented all relevant documentation on the behalf of Wilhemena Durie, MD,as directed by  Wilhemena Durie, MD while in the presence of Wilhemena Durie, MD. ? ? ?Patient: Jonathon Bell   DOB: 1951/06/12   71 y.o. Male  MRN: [id number] ?Visit Date: 02/15/2022 ? ?Today's healthcare provider: Wilhemena Durie, MD  ? ?Chief Complaint  ?Patient presents with  ? Follow-up  ? Hypertension  ? ?Subjective  ?  ?HPI  ?Patient comes in today for follow-up.  He has been feeling well.  He has no complaints.  He is not really exercising.  No chest pain shortness of breath or neurologic symptoms.  Patient's weight is stable ?Hypertension, follow-up ? ?BP Readings from Last 3 Encounters:  ?02/15/22 (!) 159/88  ?11/23/21 (!) 158/92  ?08/08/21 135/87  ? Wt Readings from Last 3 Encounters:  ?02/15/22 277 lb (125.6 kg)  ?11/23/21 279 lb (126.6 kg)  ?08/08/21 275 lb (124.7 kg)  ?  ? ?He was last seen for hypertension 3 months ago.  ?BP at that visit was 158/92.  ?Management since that visit includes; Uncontrolled. Increased amlodipine from 5 mg to 10 mg daily. ? ?He reports good compliance with treatment. ?He is not having side effects. none ?He is following a Regular diet. ?He is exercising. ?He does not smoke. ? ?Use of agents associated with hypertension: none.  ? ?Outside blood pressures are not checking. ? ?Pertinent labs ?Lab Results  ?Component Value Date  ? CHOL 254 (H) 11/23/2021  ? HDL 46 11/23/2021  ? LDLCALC 193 (H) 11/23/2021  ? TRIG 89 11/23/2021  ? CHOLHDL 5.5 (H) 11/23/2021  ? Lab Results  ?Component Value Date  ? NA 139 11/23/2021  ? K 4.5 11/23/2021  ? CREATININE 1.10 11/23/2021  ? EGFR 73 11/23/2021  ? GLUCOSE 87 11/23/2021  ? TSH 2.070 07/19/2021  ?  ? ?The 10-year ASCVD risk score (Arnett DK, et al., 2019) is:  31.9% ? ?--------------------------------------------------------------------------------------------------- ? ? ?Medications: ?Outpatient Medications Prior to Visit  ?Medication Sig  ? amLODipine (NORVASC) 10 MG tablet Take 1 tablet (10 mg total) by mouth daily.  ? meloxicam (MOBIC) 15 MG tablet Mobic 15 mg tablet ? Take 1 tablet every day by oral route.  ? naproxen (NAPROSYN) 500 MG tablet 2 times daily as needed  ? atorvastatin (LIPITOR) 10 MG tablet Take 1 tablet (10 mg total) by mouth daily. (Patient not taking: Reported on 10/31/2017)  ? lidocaine (LIDODERM) 5 % Place 1 patch onto the skin daily. Remove & Discard patch within 12 hours or as directed by MD (Patient not taking: Reported on 11/23/2021)  ? ?No facility-administered medications prior to visit.  ? ? ?Review of Systems  ?Constitutional:  Negative for appetite change, chills and fever.  ?Respiratory:  Negative for chest tightness, shortness of breath and wheezing.   ?Cardiovascular:  Negative for chest pain and palpitations.  ?Gastrointestinal:  Negative for abdominal pain, nausea and vomiting.  ? ?Last lipids ?Lab Results  ?Component Value Date  ? CHOL 254 (H) 11/23/2021  ? HDL 46 11/23/2021  ? LDLCALC 193 (H) 11/23/2021  ? TRIG 89 11/23/2021  ? CHOLHDL 5.5 (H) 11/23/2021  ? ?  ?  Objective  ?  ?BP (!) 159/88 (BP Location: Left Arm, Cuff Size: Large)   Pulse 60   Resp 16   Wt 277 lb (125.6 kg)   SpO2 98%  BMI 37.57 kg/m?  ?BP Readings from Last 3 Encounters:  ?02/15/22 (!) 159/88  ?11/23/21 (!) 158/92  ?08/08/21 135/87  ? ?Wt Readings from Last 3 Encounters:  ?02/15/22 277 lb (125.6 kg)  ?11/23/21 279 lb (126.6 kg)  ?08/08/21 275 lb (124.7 kg)  ? ?  ? ?Physical Exam ?Vitals reviewed.  ?Constitutional:   ?   General: He is not in acute distress. ?   Appearance: He is well-developed.  ?HENT:  ?   Head: Normocephalic and atraumatic.  ?   Right Ear: Hearing normal.  ?   Left Ear: Hearing normal.  ?   Nose: Nose normal.  ?Eyes:  ?   General: Lids are  normal. No scleral icterus.    ?   Right eye: No discharge.     ?   Left eye: No discharge.  ?   Conjunctiva/sclera: Conjunctivae normal.  ?Cardiovascular:  ?   Rate and Rhythm: Normal rate and regular rhythm.  ?   Heart sounds: Normal heart sounds.  ?Pulmonary:  ?   Effort: Pulmonary effort is normal. No respiratory distress.  ?Musculoskeletal:  ?   Comments: 1+ lower extremity edema.  ?Skin: ?   General: Skin is warm and dry.  ?   Findings: No lesion or rash.  ?Neurological:  ?   General: No focal deficit present.  ?   Mental Status: He is alert and oriented to person, place, and time.  ?Psychiatric:     ?   Mood and Affect: Mood normal.     ?   Speech: Speech normal.     ?   Behavior: Behavior normal.     ?   Thought Content: Thought content normal.     ?   Judgment: Judgment normal.  ?  ? ? ?No results found for any visits on 02/15/22. ? Assessment & Plan  ?  ? ?1. Essential (primary) hypertension ?Add telmisartan 20 mg daily.  Follow-up in 1 to 2 months. ? ?2. Primary osteoarthritis of both knees ? ? ?3. Pure hypercholesterolemia ?On atorvastatin. ? ?4. Class 2 severe obesity due to excess calories with serious comorbidity and body mass index (BMI) of 37.0 to 37.9 in adult [facility]) ?Diet and exercise stressed.  Patient with hyperlipidemia arthritis and hypertension ? ? ?No follow-ups on file.  ?   ? ?I, Wilhemena Durie, MD, have reviewed all documentation for this visit. The documentation on 02/22/22 for the exam, diagnosis, procedures, and orders are all accurate and complete. ? ? ? ?Kael Keetch Cranford Mon, MD  ?[facility] ?[phone] (phone) ?[phone] (fax) ? ?[facility] ?

## 2022-04-17 NOTE — Progress Notes (Unsigned)
      Established patient visit   Patient: Jonathon Bell   DOB: 06-30-1951   71 y.o. Male  MRN: [id number] Visit Date: 04/18/2022  Today's healthcare provider: Wilhemena Durie, MD   No chief complaint on file.  Subjective    HPI  Hypertension, follow-up  BP Readings from Last 3 Encounters:  02/15/22 (!) 159/88  11/23/21 (!) 158/92  08/08/21 135/87   Wt Readings from Last 3 Encounters:  02/15/22 277 lb (125.6 kg)  11/23/21 279 lb (126.6 kg)  08/08/21 275 lb (124.7 kg)     He was last seen for hypertension 2 months ago.  BP at that visit was as above. Management since that visit includes adding Telmisartan 20 mg daily.  He reports {excellent/good/fair/poor:19665} compliance with treatment. He {is/is not:9024} having side effects. {document side effects if present:1} He is following a {diet:21022986} diet. He {is/is not:9024} exercising. He {does/does not:200015} smoke.  Use of agents associated with hypertension: {bp agents assoc with hypertension:511::"none"}.   Outside blood pressures are {***enter patient reported home BP readings, or 'not being checked':1}. Symptoms: {Yes/No:20286} chest pain {Yes/No:20286} chest pressure  {Yes/No:20286} palpitations {Yes/No:20286} syncope  {Yes/No:20286} dyspnea {Yes/No:20286} orthopnea  {Yes/No:20286} paroxysmal nocturnal dyspnea {Yes/No:20286} lower extremity edema   Pertinent labs Lab Results  Component Value Date   CHOL 254 (H) 11/23/2021   HDL 46 11/23/2021   LDLCALC 193 (H) 11/23/2021   TRIG 89 11/23/2021   CHOLHDL 5.5 (H) 11/23/2021   Lab Results  Component Value Date   NA 139 11/23/2021   K 4.5 11/23/2021   CREATININE 1.10 11/23/2021   EGFR 73 11/23/2021   GLUCOSE 87 11/23/2021   TSH 2.070 07/19/2021     The 10-year ASCVD risk score (Arnett DK, et al., 2019) is: 31.9%  ---------------------------------------------------------------------------------------------------   Medications: Outpatient  Medications Prior to Visit  Medication Sig   amLODipine (NORVASC) 10 MG tablet Take 1 tablet (10 mg total) by mouth daily.   atorvastatin (LIPITOR) 10 MG tablet Take 1 tablet (10 mg total) by mouth daily. (Patient not taking: Reported on 10/31/2017)   lidocaine (LIDODERM) 5 % Place 1 patch onto the skin daily. Remove & Discard patch within 12 hours or as directed by MD (Patient not taking: Reported on 11/23/2021)   meloxicam (MOBIC) 15 MG tablet Mobic 15 mg tablet  Take 1 tablet every day by oral route.   naproxen (NAPROSYN) 500 MG tablet 2 times daily as needed   telmisartan (MICARDIS) 20 MG tablet Take 1 tablet (20 mg total) by mouth daily.   No facility-administered medications prior to visit.    Review of Systems  {Labs  Heme  Chem  Endocrine  Serology  Results Review (optional):23779}   Objective    There were no vitals taken for this visit. {Show previous vital signs (optional):23777}  Physical Exam  ***  No results found for any visits on 04/18/22.  Assessment & Plan     ***  No follow-ups on file.      {provider attestation***:1}   Wilhemena Durie, MD  [facility] [phone] (phone) [phone] (fax)  [place]

## 2022-04-17 NOTE — Progress Notes (Unsigned)
Established patient visit  I,Lakevia Perris,acting as a scribe for Wilhemena Durie, MD.,have documented all relevant documentation on the behalf of Wilhemena Durie, MD,as directed by  Wilhemena Durie, MD while in the presence of Wilhemena Durie, MD.   Patient: Jonathon Bell   DOB: 11-29-1950   71 y.o. Male  MRN: [id number] Visit Date: 04/18/2022  Today's healthcare provider: Wilhemena Durie, MD   Chief Complaint  Patient presents with   Follow-up   Hypertension   Subjective    HPI  Hypertension, follow-up  BP Readings from Last 3 Encounters:  04/18/22 (!) 160/96  02/15/22 (!) 159/88  11/23/21 (!) 158/92   Wt Readings from Last 3 Encounters:  04/18/22 287 lb (130.2 kg)  02/15/22 277 lb (125.6 kg)  11/23/21 279 lb (126.6 kg)     He was last seen for hypertension 2 months ago.  Management since that visit includes; Added telmisartan 20 mg daily.  Follow-up in 1 to 2 months..  Outside blood pressures are not checking.  Pertinent labs Lab Results  Component Value Date   CHOL 254 (H) 11/23/2021   HDL 46 11/23/2021   LDLCALC 193 (H) 11/23/2021   TRIG 89 11/23/2021   CHOLHDL 5.5 (H) 11/23/2021   Lab Results  Component Value Date   NA 139 11/23/2021   K 4.5 11/23/2021   CREATININE 1.10 11/23/2021   EGFR 73 11/23/2021   GLUCOSE 87 11/23/2021   TSH 2.070 07/19/2021     The 10-year ASCVD risk score (Arnett DK, et al., 2019) is: 32.2%  ---------------------------------------------------------------------------------------------------   Medications: Outpatient Medications Prior to Visit  Medication Sig   telmisartan (MICARDIS) 20 MG tablet Take 1 tablet (20 mg total) by mouth daily.   [DISCONTINUED] amLODipine (NORVASC) 10 MG tablet Take 1 tablet (10 mg total) by mouth daily.   [DISCONTINUED] meloxicam (MOBIC) 15 MG tablet Mobic 15 mg tablet  Take 1 tablet every day by oral route.   [DISCONTINUED] atorvastatin (LIPITOR) 10 MG tablet Take 1  tablet (10 mg total) by mouth daily. (Patient not taking: Reported on 10/31/2017)   [DISCONTINUED] lidocaine (LIDODERM) 5 % Place 1 patch onto the skin daily. Remove & Discard patch within 12 hours or as directed by MD (Patient not taking: Reported on 11/23/2021)   [DISCONTINUED] naproxen (NAPROSYN) 500 MG tablet 2 times daily as needed (Patient not taking: Reported on 04/18/2022)   No facility-administered medications prior to visit.    Review of Systems  Constitutional:  Negative for appetite change, chills and fever.  Respiratory:  Negative for chest tightness, shortness of breath and wheezing.   Cardiovascular:  Negative for chest pain and palpitations.  Gastrointestinal:  Negative for abdominal pain, nausea and vomiting.    {Labs  Heme  Chem  Endocrine  Serology  Results Review (optional):23779}   Objective    BP (!) 160/96 (BP Location: Left Arm, Patient Position: Sitting, Cuff Size: Large)   Pulse 62   Resp 16   Wt 287 lb (130.2 kg)   SpO2 100%   BMI 38.92 kg/m  {Show previous vital signs (optional):23777}  Physical Exam  ***  No results found for any visits on 04/18/22.  Assessment & Plan     1. Essential (primary) hypertension  - amLODipine (NORVASC) 10 MG tablet; Take 1 tablet (10 mg total) by mouth daily.  Dispense: 90 tablet; Refill: 3  2. Class 2 severe obesity due to excess calories with serious comorbidity and body mass  index (BMI) of 37.0 to 37.9 in adult [facility])    Return in about 3 months (around 07/19/2022).      {provider attestation***:1}   Wilhemena Durie, MD  [facility] [phone] (phone) [phone] (fax)  [place]

## 2022-04-18 ENCOUNTER — Encounter: Payer: Self-pay | Admitting: Family Medicine

## 2022-04-18 ENCOUNTER — Ambulatory Visit (INDEPENDENT_AMBULATORY_CARE_PROVIDER_SITE_OTHER): Payer: Medicare Other | Admitting: Family Medicine

## 2022-04-18 VITALS — BP 160/96 | HR 62 | Resp 16 | Wt 287.0 lb

## 2022-04-18 DIAGNOSIS — E7800 Pure hypercholesterolemia, unspecified: Secondary | ICD-10-CM | POA: Diagnosis not present

## 2022-04-18 DIAGNOSIS — Z6837 Body mass index (BMI) 37.0-37.9, adult: Secondary | ICD-10-CM | POA: Diagnosis not present

## 2022-04-18 DIAGNOSIS — I10 Essential (primary) hypertension: Secondary | ICD-10-CM

## 2022-04-18 MED ORDER — MELOXICAM 15 MG PO TABS: 15.0000 mg | ORAL_TABLET | Freq: Every day | ORAL | 1 refills | Status: AC | PRN

## 2022-04-18 MED ORDER — AMLODIPINE BESYLATE 10 MG PO TABS: 10.0000 mg | ORAL_TABLET | Freq: Every day | ORAL | 3 refills | Status: DC

## 2022-04-18 NOTE — Patient Instructions (Signed)
Get [employer] or [employer] blood pressure cuff for home use and check blood pressures weekly and bring in those readings on next visit.

## 2022-07-20 ENCOUNTER — Ambulatory Visit (INDEPENDENT_AMBULATORY_CARE_PROVIDER_SITE_OTHER): Payer: Medicare Other

## 2022-07-20 VITALS — BP 132/80 | Ht 72.0 in | Wt 289.6 lb

## 2022-07-20 DIAGNOSIS — Z0000 Encounter for general adult medical examination without abnormal findings: Secondary | ICD-10-CM

## 2022-07-20 NOTE — Patient Instructions (Signed)
Jonathon Bell , Thank you for taking time to come for your Medicare Wellness Visit. I appreciate your ongoing commitment to your health goals. Please review the following plan we discussed and let me know if I can assist you in the future.   Screening recommendations/referrals: Colonoscopy: 08/08/21 Recommended yearly ophthalmology/optometry visit for glaucoma screening and checkup Recommended yearly dental visit for hygiene and checkup  Vaccinations: Influenza vaccine: n/d Pneumococcal vaccine: n/d Tdap vaccine: 05/26/14 Shingles vaccine: Zostavax 05/26/14   Covid-19: n/d  Advanced directives: no  Conditions/risks identified: none  Next appointment: Follow up in one year for your annual wellness visit. 07/24/23 @ 10:15 am in person  Preventive Care 70 Years and Older, Male Preventive care refers to lifestyle choices and visits with your health care provider that can promote health and wellness. What does preventive care include? A yearly physical exam. This is also called an annual well check. Dental exams once or twice a year. Routine eye exams. Ask your health care provider how often you should have your eyes checked. Personal lifestyle choices, including: Daily care of your teeth and gums. Regular physical activity. Eating a healthy diet. Avoiding tobacco and drug use. Limiting alcohol use. Practicing safe sex. Taking low doses of aspirin every day. Taking vitamin and mineral supplements as recommended by your health care provider. What happens during an annual well check? The services and screenings done by your health care provider during your annual well check will depend on your age, overall health, lifestyle risk factors, and family history of disease. Counseling  Your health care provider may ask you questions about your: Alcohol use. Tobacco use. Drug use. Emotional well-being. Home and relationship well-being. Sexual activity. Eating habits. History of  falls. Memory and ability to understand (cognition). Work and work Statistician. Screening  You may have the following tests or measurements: Height, weight, and BMI. Blood pressure. Lipid and cholesterol levels. These may be checked every 5 years, or more frequently if you are over 64 years old. Skin check. Lung cancer screening. You may have this screening every year starting at age 68 if you have a 30-pack-year history of smoking and currently smoke or have quit within the past 15 years. Fecal occult blood test (FOBT) of the stool. You may have this test every year starting at age 51. Flexible sigmoidoscopy or colonoscopy. You may have a sigmoidoscopy every 5 years or a colonoscopy every 10 years starting at age 24. Prostate cancer screening. Recommendations will vary depending on your family history and other risks. Hepatitis C blood test. Hepatitis B blood test. Sexually transmitted disease (STD) testing. Diabetes screening. This is done by checking your blood sugar (glucose) after you have not eaten for a while (fasting). You may have this done every 1-3 years. Abdominal aortic aneurysm (AAA) screening. You may need this if you are a current or former smoker. Osteoporosis. You may be screened starting at age 38 if you are at high risk. Talk with your health care provider about your test results, treatment options, and if necessary, the need for more tests. Vaccines  Your health care provider may recommend certain vaccines, such as: Influenza vaccine. This is recommended every year. Tetanus, diphtheria, and acellular pertussis (Tdap, Td) vaccine. You may need a Td booster every 10 years. Zoster vaccine. You may need this after age 52. Pneumococcal 13-valent conjugate (PCV13) vaccine. One dose is recommended after age 29. Pneumococcal polysaccharide (PPSV23) vaccine. One dose is recommended after age 66. Talk to your health care provider  about which screenings and vaccines you need and  how often you need them. This information is not intended to replace advice given to you by your health care provider. Make sure you discuss any questions you have with your health care provider. Document Released: 11/12/2015 Document Revised: 07/05/2016 Document Reviewed: 08/17/2015 Elsevier Interactive Patient Education  2017 [place] Prevention in the Home Falls can cause injuries. They can happen to people of all ages. There are many things you can do to make your home safe and to help prevent falls. What can I do on the outside of my home? Regularly fix the edges of walkways and driveways and fix any cracks. Remove anything that might make you trip as you walk through a door, such as a raised step or threshold. Trim any bushes or trees on the path to your home. Use bright outdoor lighting. Clear any walking paths of anything that might make someone trip, such as rocks or tools. Regularly check to see if handrails are loose or broken. Make sure that both sides of any steps have handrails. Any raised decks and porches should have guardrails on the edges. Have any leaves, snow, or ice cleared regularly. Use sand or salt on walking paths during winter. Clean up any spills in your garage right away. This includes oil or grease spills. What can I do in the bathroom? Use night lights. Install grab bars by the toilet and in the tub and shower. Do not use towel bars as grab bars. Use non-skid mats or decals in the tub or shower. If you need to sit down in the shower, use a plastic, non-slip stool. Keep the floor dry. Clean up any water that spills on the floor as soon as it happens. Remove soap buildup in the tub or shower regularly. Attach bath mats securely with double-sided non-slip rug tape. Do not have throw rugs and other things on the floor that can make you trip. What can I do in the bedroom? Use night lights. Make sure that you have a light by your bed that is easy to  reach. Do not use any sheets or blankets that are too big for your bed. They should not hang down onto the floor. Have a firm chair that has side arms. You can use this for support while you get dressed. Do not have throw rugs and other things on the floor that can make you trip. What can I do in the kitchen? Clean up any spills right away. Avoid walking on wet floors. Keep items that you use a lot in easy-to-reach places. If you need to reach something above you, use a strong step stool that has a grab bar. Keep electrical cords out of the way. Do not use floor polish or wax that makes floors slippery. If you must use wax, use non-skid floor wax. Do not have throw rugs and other things on the floor that can make you trip. What can I do with my stairs? Do not leave any items on the stairs. Make sure that there are handrails on both sides of the stairs and use them. Fix handrails that are broken or loose. Make sure that handrails are as long as the stairways. Check any carpeting to make sure that it is firmly attached to the stairs. Fix any carpet that is loose or worn. Avoid having throw rugs at the top or bottom of the stairs. If you do have throw rugs, attach them to the floor  with carpet tape. Make sure that you have a light switch at the top of the stairs and the bottom of the stairs. If you do not have them, ask someone to add them for you. What else can I do to help prevent falls? Wear shoes that: Do not have high heels. Have rubber bottoms. Are comfortable and fit you well. Are closed at the toe. Do not wear sandals. If you use a stepladder: Make sure that it is fully opened. Do not climb a closed stepladder. Make sure that both sides of the stepladder are locked into place. Ask someone to hold it for you, if possible. Clearly mark and make sure that you can see: Any grab bars or handrails. First and last steps. Where the edge of each step is. Use tools that help you move  around (mobility aids) if they are needed. These include: Canes. Walkers. Scooters. Crutches. Turn on the lights when you go into a dark area. Replace any light bulbs as soon as they burn out. Set up your furniture so you have a clear path. Avoid moving your furniture around. If any of your floors are uneven, fix them. If there are any pets around you, be aware of where they are. Review your medicines with your doctor. Some medicines can make you feel dizzy. This can increase your chance of falling. Ask your doctor what other things that you can do to help prevent falls. This information is not intended to replace advice given to you by your health care provider. Make sure you discuss any questions you have with your health care provider. Document Released: 08/12/2009 Document Revised: 03/23/2016 Document Reviewed: 11/20/2014 Elsevier Interactive Patient Education  2017 [employer].

## 2022-07-20 NOTE — Progress Notes (Signed)
Subjective:   Jonathon Bell is a 71 y.o. male who presents for Medicare Annual/Subsequent preventive examination.  Review of Systems           Objective:    Today's Vitals   07/20/22 1304  BP: 132/80  Weight: 289 lb 9.6 oz (131.4 kg)  Height: 6' (1.829 m)   Body mass index is 39.28 kg/m.     07/20/2022    1:12 PM 08/08/2021    8:43 AM 08/09/2015    8:12 AM  Advanced Directives  Does Patient Have a Medical Advance Directive? No No No  Would patient like information on creating a medical advance directive? No - Patient declined      Current Medications (verified) Outpatient Encounter Medications as of 07/20/2022  Medication Sig   amLODipine (NORVASC) 10 MG tablet Take 1 tablet (10 mg total) by mouth daily.   meloxicam (MOBIC) 15 MG tablet Take 1 tablet (15 mg total) by mouth daily as needed for pain. Take as needed for moderate to severe pain   telmisartan (MICARDIS) 20 MG tablet Take 1 tablet (20 mg total) by mouth daily.   cephALEXin (KEFLEX) 500 MG capsule Take 1 capsule every 6 hours by oral route for 5 days. (Patient not taking: Reported on 07/20/2022)   colchicine 0.6 MG tablet take 2 pills initially, and one additional pill and hour later. Then 1 pill daily for 4 days (Patient not taking: Reported on 07/20/2022)   sulfamethoxazole-trimethoprim (BACTRIM DS) 800-160 MG tablet Take 1 tablet every 12 hours by oral route for 5 days. (Patient not taking: Reported on 07/20/2022)   traMADol (ULTRAM) 50 MG tablet Take 1 tablet every 6 hours by oral route as needed for 5 days. (Patient not taking: Reported on 07/20/2022)   No facility-administered encounter medications on file as of 07/20/2022.    Allergies (verified) Patient has no known allergies.   History: Past Medical History:  Diagnosis Date   Hypertension    Past Surgical History:  Procedure Laterality Date   COLONOSCOPY WITH PROPOFOL N/A 08/08/2021   Procedure: COLONOSCOPY WITH PROPOFOL;  Surgeon: Jonathon Bell, Jonathon  Reddy, MD;  [facility];  Service: Gastroenterology;  Laterality: N/A;   FRACTURE SURGERY     arm requiring surgery on tendons   Family History  Problem Relation Age of Onset   Heart disease Mother    Hypertension Mother    Lung cancer Father    Diabetes Sister    Heart disease Sister    Cirrhosis Sister        non alcoholic   Social History   Socioeconomic History   Marital status: Divorced    Spouse name: Not on file   Number of children: Not on file   Years of education: Not on file   Highest education level: Not on file  Occupational History   Not on file  Tobacco Use   Smoking status: Former    Packs/day: 0.50    Years: 6.00    Total pack years: 3.00    Types: Cigarettes   Smokeless tobacco: Former    Quit date: 10/31/1979  Substance and Sexual Activity   Alcohol use: No    Alcohol/week: 0.0 standard drinks of alcohol   Drug use: No   Sexual activity: Not on file  Other Topics Concern   Not on file  Social History Narrative   Not on file   Social Determinants of Health   Financial Resource Strain: Low Risk  (07/20/2022)  Overall Financial Resource Strain (CARDIA)    Difficulty of Paying Living Expenses: Not hard at all  Food Insecurity: No Food Insecurity (07/20/2022)   Hunger Vital Sign    Worried About Running Out of Food in the Last Year: Never true    Ran Out of Food in the Last Year: Never true  Transportation Needs: No Transportation Needs (07/20/2022)   PRAPARE - Administrator, Civil Service (Medical): No    Lack of Transportation (Non-Medical): No  Physical Activity: Insufficiently Active (07/20/2022)   Exercise Vital Sign    Days of Exercise per Week: 2 days    Minutes of Exercise per Session: 20 min  Stress: No Stress Concern Present (07/20/2022)   [employer] of Occupational Health - Occupational Stress Questionnaire    Feeling of Stress : Not at all  Social Connections: Socially Isolated (07/20/2022)   Social  Connection and Isolation Panel [NHANES]    Frequency of Communication with Friends and Family: Twice a week    Frequency of Social Gatherings with Friends and Family: Twice a week    Attends Religious Services: Never    Diplomatic Services operational officer: Not on file    Attends Banker Meetings: Never    Marital Status: Divorced    Tobacco Counseling Counseling given: Not Answered   Clinical Intake:  Pre-visit preparation completed: Yes  Pain : No/denies pain     Nutritional Risks: None Diabetes: No  How often do you need to have someone help you when you read instructions, pamphlets, or other written materials from your doctor or pharmacy?: 1 - Never  Diabetic?no  Interpreter Needed?: No  Information entered by :: Jonathon Bucker, LPN   Activities of Daily Living    07/20/2022   12:49 PM 04/18/2022    8:18 AM  In your present state of health, do you have any difficulty performing the following activities:  Hearing? 0 1  Vision?  0  Difficulty concentrating or making decisions? 0 0  Walking or climbing stairs? 0 1  Dressing or bathing? 0 0  Doing errands, shopping? 0 0  Preparing Food and eating ? N   Using the Toilet? N   In the past six months, have you accidently leaked urine? N   Do you have problems with loss of bowel control? N   Managing your Medications? N   Managing your Finances? N   Housekeeping or managing your Housekeeping? N     Patient Care Team: Maple Hudson., MD as PCP - General (Family Medicine)  Indicate any recent Medical Services you may have received from other than [facility] providers in the past year (date may be approximate).     Assessment:   This is a routine wellness examination for Jonathon Bell.  Hearing/Vision screen Hearing Screening - Comments:: No aids Vision Screening - Comments:: No glasses- [facility]  Dietary issues and exercise activities discussed:     Goals Addressed             This  Visit's Progress    DIET - EAT MORE FRUITS AND VEGETABLES        Depression Screen    07/20/2022    1:10 PM 04/18/2022    8:18 AM 11/23/2021    8:19 AM 07/19/2021    9:43 AM 10/31/2017   10:11 AM  PHQ 2/9 Scores  PHQ - 2 Score 0 0 0 0 1  PHQ- 9 Score 0 0 0 2 3  Fall Risk    07/20/2022    1:13 PM 07/20/2022   12:49 PM 04/18/2022    8:18 AM 11/23/2021    8:19 AM 07/19/2021    9:43 AM  Fall Risk   Falls in the past year? 0 0 0 0 0  Number falls in past yr: 0 0 0 0 0  Injury with Fall? 0 0 0 0 0  Risk for fall due to : No Fall Risks  No Fall Risks No Fall Risks No Fall Risks  Follow up Falls prevention discussed;Falls evaluation completed  Falls evaluation completed Falls evaluation completed Falls evaluation completed    FALL RISK PREVENTION PERTAINING TO THE HOME:  Any stairs in or around the home? No  If so, are there any without handrails? No  Home free of loose throw rugs in walkways, pet beds, electrical cords, etc? Yes  Adequate lighting in your home to reduce risk of falls? Yes   ASSISTIVE DEVICES UTILIZED TO PREVENT FALLS:  Life alert? No  Use of a cane, walker or w/c? No  Grab bars in the bathroom? No  Shower chair or bench in shower? No  Elevated toilet seat or a handicapped toilet? No   TIMED UP AND GO:  Was the test performed? Yes .  Length of time to ambulate 10 feet: 4 sec.   Gait steady and fast without use of assistive device  Cognitive Function:        Immunizations Immunization History  Administered Date(s) Administered   Td 08/11/2004   Tdap 05/26/2014   Zoster, Live 05/26/2014    TDAP status: Up to date  Flu Vaccine status: Declined, Education has been provided regarding the importance of this vaccine but patient still declined. Advised may receive this vaccine at local pharmacy or Health Dept. Aware to provide a copy of the vaccination record if obtained from local pharmacy or Health Dept. Verbalized acceptance and  understanding.  Pneumococcal vaccine status: Declined,  Education has been provided regarding the importance of this vaccine but patient still declined. Advised may receive this vaccine at local pharmacy or Health Dept. Aware to provide a copy of the vaccination record if obtained from local pharmacy or Health Dept. Verbalized acceptance and understanding.   Covid-19 vaccine status: Declined, Education has been provided regarding the importance of this vaccine but patient still declined. Advised may receive this vaccine at local pharmacy or Health Dept.or vaccine clinic. Aware to provide a copy of the vaccination record if obtained from local pharmacy or Health Dept. Verbalized acceptance and understanding.  Qualifies for Shingles Vaccine? Yes   Zostavax completed Yes   Shingrix Completed?: No.    Education has been provided regarding the importance of this vaccine. Patient has been advised to call insurance company to determine out of pocket expense if they have not yet received this vaccine. Advised may also receive vaccine at local pharmacy or Health Dept. Verbalized acceptance and understanding.  Screening Tests Health Maintenance  Topic Date Due   Hepatitis C Screening  Never done   INFLUENZA VACCINE  Never done   COVID-19 Vaccine (1) 08/04/2022 (Originally 04/17/1952)   Pneumonia Vaccine 69+ Years old (1 - PCV) 11/23/2022 (Originally 10/17/2017)   Zoster Vaccines- Shingrix (1 of 2) 11/23/2022 (Originally 10/17/2002)   TETANUS/TDAP  05/26/2024   COLONOSCOPY (Pts 45-26yrs Insurance coverage will need to be confirmed)  08/08/2028   HPV VACCINES  Aged Out    Health Maintenance  Health Maintenance Due  Topic Date Due  Hepatitis C Screening  Never done   INFLUENZA VACCINE  Never done    Colorectal cancer screening: Type of screening: Colonoscopy. Completed 08/08/21. Repeat every 7 years  Lung Cancer Screening: (Low Dose CT Chest recommended if Age 7-80 years, 30 pack-year currently  smoking OR have quit w/in 15years.) does not qualify.   Additional Screening:  Hepatitis C Screening: does qualify; Completed no  Vision Screening: Recommended annual ophthalmology exams for early detection of glaucoma and other disorders of the eye. Is the patient up to date with their annual eye exam?  Yes  Who is the provider or what is the name of the office in which the patient attends annual eye exams? [facility] If pt is not established with a provider, would they like to be referred to a provider to establish care? No .   Dental Screening: Recommended annual dental exams for proper oral hygiene  Community Resource Referral / Chronic Care Management: CRR required this visit?  No   CCM required this visit?  No      Plan:     I have personally reviewed and noted the following in the patient's chart:   Medical and social history Use of alcohol, tobacco or illicit drugs  Current medications and supplements including opioid prescriptions. Patient is not currently taking opioid prescriptions. Functional ability and status Nutritional status Physical activity Advanced directives List of other physicians Hospitalizations, surgeries, and ER visits in previous 12 months Vitals Screenings to include cognitive, depression, and falls Referrals and appointments  In addition, I have reviewed and discussed with patient certain preventive protocols, quality metrics, and best practice recommendations. A written personalized care plan for preventive services as well as general preventive health recommendations were provided to patient.     Hal Hope, LPN   02/08/8785   Nurse Notes: none

## 2022-07-24 ENCOUNTER — Other Ambulatory Visit: Payer: Self-pay | Admitting: Family Medicine

## 2022-07-24 MED ORDER — TELMISARTAN 20 MG PO TABS: 20.0000 mg | ORAL_TABLET | Freq: Every day | ORAL | 11 refills | Status: DC

## 2022-07-31 ENCOUNTER — Ambulatory Visit (INDEPENDENT_AMBULATORY_CARE_PROVIDER_SITE_OTHER): Payer: Medicare Other | Admitting: Family Medicine

## 2022-07-31 ENCOUNTER — Encounter: Payer: Self-pay | Admitting: Family Medicine

## 2022-07-31 ENCOUNTER — Ambulatory Visit: Payer: Medicare Other | Admitting: Family Medicine

## 2022-07-31 DIAGNOSIS — M170 Bilateral primary osteoarthritis of knee: Secondary | ICD-10-CM | POA: Diagnosis not present

## 2022-07-31 DIAGNOSIS — Z1159 Encounter for screening for other viral diseases: Secondary | ICD-10-CM

## 2022-07-31 DIAGNOSIS — E7800 Pure hypercholesterolemia, unspecified: Secondary | ICD-10-CM | POA: Diagnosis not present

## 2022-07-31 DIAGNOSIS — I10 Essential (primary) hypertension: Secondary | ICD-10-CM | POA: Diagnosis not present

## 2022-07-31 DIAGNOSIS — Z0000 Encounter for general adult medical examination without abnormal findings: Secondary | ICD-10-CM

## 2022-07-31 NOTE — Assessment & Plan Note (Signed)
Chronic, stable  BP elevated today  He will continue micardis 20mg  daily and amlodipine 10mg  daily  We will check CMP and have him follow up in 6 months for BP

## 2022-07-31 NOTE — Progress Notes (Addendum)
Established patient visit  I,Joseline E Rosas,acting as a scribe for [employer], MD.,have documented all relevant documentation on the behalf of Eulis Foster, MD,as directed by  Eulis Foster, MD while in the presence of Eulis Foster, MD.   Patient: Jonathon Bell   DOB: 01/05/1951   71 y.o. Male  MRN: [id number] Visit Date: 07/31/2022  Today's healthcare provider: Eulis Foster, MD   Chief Complaint  Patient presents with   follow-up chronic disease   Subjective    HPI Patient had AWV with [facility] 07/20/2022 Hypertension, follow-up  BP Readings from Last 3 Encounters:  07/31/22 (!) 150/79  07/20/22 132/80  04/18/22 (!) 160/96   Wt Readings from Last 3 Encounters:  07/31/22 284 lb (128.8 kg)  07/20/22 289 lb 9.6 oz (131.4 kg)  04/18/22 287 lb (130.2 kg)     He was last seen for hypertension 3 months ago.  BP at that visit was 160/96. Management since that visit includes continue current meds and start Amlodipine 10 mg.  Check home blood pressure readings and follow-up in 3 months.  He reports excellent compliance with treatment. He is not having side effects.  He is not adherent to low salt diet.   Outside blood pressures are not being checked.  He does not smoke. --------------------------------------------------------------------------------------------------- Lipid/Cholesterol, follow-up  Last Lipid Panel: Lab Results  Component Value Date   CHOL 235 (H) 07/31/2022   LDLCALC 174 (H) 07/31/2022   HDL 43 07/31/2022   TRIG 101 07/31/2022    He was last seen for this 5 months ago.  Management since that visit includes recommended Statin, per patient will work on weight loss, diet, exercise.  Symptoms: No appetite changes No foot ulcerations  No chest pain No chest pressure/discomfort  No dyspnea No orthopnea  No fatigue No lower extremity edema  No palpitations No paroxysmal nocturnal dyspnea  No  nausea No numbness or tingling of extremity  No polydipsia No polyuria  No speech difficulty No syncope   He is following a Regular diet. Current exercise: walking  Last metabolic panel Lab Results  Component Value Date   GLUCOSE 95 07/31/2022   NA 137 07/31/2022   K 4.8 07/31/2022   BUN 16 07/31/2022   CREATININE 1.27 07/31/2022   EGFR 61 07/31/2022   GFRNONAA >60 06/29/2021   CALCIUM 9.4 07/31/2022   AST 24 07/31/2022   ALT 23 07/31/2022   The 10-year ASCVD risk score (Arnett DK, et al., 2019) is: 28.9%  ---------------------------------------------------------------------------------------------------   Medications: Outpatient Medications Prior to Visit  Medication Sig   amLODipine (NORVASC) 10 MG tablet Take 1 tablet (10 mg total) by mouth daily.   colchicine 0.6 MG tablet    meloxicam (MOBIC) 15 MG tablet Take 1 tablet (15 mg total) by mouth daily as needed for pain. Take as needed for moderate to severe pain   telmisartan (MICARDIS) 20 MG tablet Take 1 tablet (20 mg total) by mouth daily.   traMADol (ULTRAM) 50 MG tablet    [DISCONTINUED] cephALEXin (KEFLEX) 500 MG capsule Take 1 capsule every 6 hours by oral route for 5 days. (Patient not taking: Reported on 07/20/2022)   [DISCONTINUED] sulfamethoxazole-trimethoprim (BACTRIM DS) 800-160 MG tablet Take 1 tablet every 12 hours by oral route for 5 days. (Patient not taking: Reported on 07/20/2022)   No facility-administered medications prior to visit.    Review of Systems     Objective    BP (!) 150/79 (BP Location: Left Arm, Patient  Position: Sitting, Cuff Size: Large)   Pulse 63   Temp 98.3 F (36.8 C) (Oral)   Resp 16   Ht 6' (1.829 m)   Wt 284 lb (128.8 kg)   BMI 38.52 kg/m    Physical Exam Vitals reviewed.  Constitutional:      General: He is not in acute distress.    Appearance: Normal appearance. He is not ill-appearing, toxic-appearing or diaphoretic.  HENT:     Head: Normocephalic and  atraumatic.     Right Ear: Tympanic membrane and external ear normal. There is no impacted cerumen.     Left Ear: Tympanic membrane and external ear normal. There is no impacted cerumen.     Nose: Nose normal.     Mouth/Throat:     Mouth: Mucous membranes are moist.     Pharynx: No oropharyngeal exudate or posterior oropharyngeal erythema.  Eyes:     Extraocular Movements: Extraocular movements intact.     Conjunctiva/sclera: Conjunctivae normal.     Pupils: Pupils are equal, round, and reactive to light.  Cardiovascular:     Rate and Rhythm: Normal rate and regular rhythm.     Pulses: Normal pulses.     Heart sounds: Normal heart sounds. No murmur heard. Pulmonary:     Effort: Pulmonary effort is normal. No respiratory distress.     Breath sounds: Normal breath sounds. No stridor. No wheezing, rhonchi or rales.  Abdominal:     General: Bowel sounds are normal. There is no distension.     Palpations: Abdomen is soft.     Tenderness: There is no abdominal tenderness.  Musculoskeletal:        General: No swelling, tenderness or signs of injury. Normal range of motion.     Cervical back: Normal range of motion and neck supple.     Right lower leg: No edema.     Left lower leg: No edema.     Comments: Varicose veins bilaterally    Skin:    General: Skin is warm and dry.     Findings: No erythema or rash.  Neurological:     Mental Status: He is alert and oriented to person, place, and time.     Cranial Nerves: No cranial nerve deficit.     Motor: No weakness.     Gait: Gait normal.       Results for orders placed or performed in visit on 07/31/22  Comprehensive metabolic panel  Result Value Ref Range   Glucose 95 70 - 99 mg/dL   BUN 16 8 - 27 mg/dL   Creatinine, Ser 1.27 0.76 - 1.27 mg/dL   eGFR 61 >59 mL/min/1.73   BUN/Creatinine Ratio 13 10 - 24   Sodium 137 134 - 144 mmol/L   Potassium 4.8 3.5 - 5.2 mmol/L   Chloride 100 96 - 106 mmol/L   CO2 22 20 - 29 mmol/L    Calcium 9.4 8.6 - 10.2 mg/dL   Total Protein 7.3 6.0 - 8.5 g/dL   Albumin 4.8 3.9 - 4.9 g/dL   Globulin, Total 2.5 1.5 - 4.5 g/dL   Albumin/Globulin Ratio 1.9 1.2 - 2.2   Bilirubin Total 0.8 0.0 - 1.2 mg/dL   Alkaline Phosphatase 73 44 - 121 IU/L   AST 24 0 - 40 IU/L   ALT 23 0 - 44 IU/L  Hepatitis C Antibody  Result Value Ref Range   Hep C Virus Ab Non Reactive Non Reactive  Lipid panel  Result Value Ref  Range   Cholesterol, Total 235 (H) 100 - 199 mg/dL   Triglycerides 101 0 - 149 mg/dL   HDL 43 >39 mg/dL   VLDL Cholesterol Cal 18 5 - 40 mg/dL   LDL Chol Calc (NIH) 174 (H) 0 - 99 mg/dL   Chol/HDL Ratio 5.5 (H) 0.0 - 5.0 ratio    Assessment & Plan     Problem List Items Addressed This Visit       Cardiovascular and Mediastinum   Essential (primary) hypertension    Chronic, stable  BP elevated today  He will continue micardis 76m daily and amlodipine 135mdaily  We will check CMP and have him follow up in 6 months for BP       Relevant Orders   Comprehensive metabolic panel (Completed)     Musculoskeletal and Integument   Osteoarthritis of knees, bilateral    Chronic, stable  He continues meloxicam as needed and reports taking this 2 times weekly          Other   HLD (hyperlipidemia)    Chronic, stable  We will check lipid panel today  He is not on current lipid lowering therapy at this time        Relevant Orders   Lipid panel (Completed)   Other Visit Diagnoses     Encounter for hepatitis C screening test for low risk patient       Relevant Orders   Hepatitis C Antibody (Completed)        Return in about 6 months (around 01/30/2023) for htn .      I, MaEulis FosterMD, have reviewed all documentation for this visit. The documentation on 08/01/22 for the exam, diagnosis, procedures, and orders are all accurate and complete.    MaEulis FosterMD  [phone]) [phone])  [place]

## 2022-07-31 NOTE — Assessment & Plan Note (Signed)
Chronic, stable  He continues meloxicam as needed and reports taking this 2 times weekly

## 2022-07-31 NOTE — Assessment & Plan Note (Signed)
He declines influenza vaccine  He is agreeable to hep c screening today  We will complete CMP for HTN  Last colonoscopy was in 2022, recommended for 3-5 year repeat due to multiple polyps

## 2022-07-31 NOTE — Assessment & Plan Note (Signed)
Chronic, stable  We will check lipid panel today  He is not on current lipid lowering therapy at this time

## 2022-08-01 LAB — LIPID PANEL
Chol/HDL Ratio: 5.5 ratio — ABNORMAL HIGH (ref 0.0–5.0)
Cholesterol, Total: 235 mg/dL — ABNORMAL HIGH (ref 100–199)
HDL: 43 mg/dL (ref >39)
LDL Chol Calc (NIH): 174 mg/dL — ABNORMAL HIGH (ref 0–99)
Triglycerides: 101 mg/dL (ref 0–149)
VLDL Cholesterol Cal: 18 mg/dL (ref 5–40)

## 2022-08-01 LAB — COMPREHENSIVE METABOLIC PANEL
ALT: 23 IU/L (ref 0–44)
AST: 24 IU/L (ref 0–40)
Albumin/Globulin Ratio: 1.9 (ref 1.2–2.2)
Albumin: 4.8 g/dL (ref 3.9–4.9)
Alkaline Phosphatase: 73 IU/L (ref 44–121)
BUN/Creatinine Ratio: 13 (ref 10–24)
BUN: 16 mg/dL (ref 8–27)
Bilirubin Total: 0.8 mg/dL (ref 0.0–1.2)
CO2: 22 mmol/L (ref 20–29)
Calcium: 9.4 mg/dL (ref 8.6–10.2)
Chloride: 100 mmol/L (ref 96–106)
Creatinine, Ser: 1.27 mg/dL (ref 0.76–1.27)
Globulin, Total: 2.5 g/dL (ref 1.5–4.5)
Glucose: 95 mg/dL (ref 70–99)
Potassium: 4.8 mmol/L (ref 3.5–5.2)
Sodium: 137 mmol/L (ref 134–144)
Total Protein: 7.3 g/dL (ref 6.0–8.5)
eGFR: 61 mL/min/{1.73_m2} (ref >59)

## 2022-08-01 LAB — HEPATITIS C ANTIBODY: Hep C Virus Ab: NONREACTIVE

## 2022-08-01 NOTE — Addendum Note (Signed)
Addended by: Adolph Pollack on: 08/01/2022 01:24 PM   Modules accepted: Level of Service

## 2023-01-29 NOTE — Progress Notes (Unsigned)
I,Joseline E Rosas,acting as a scribe for [employer], MD.,have documented all relevant documentation on the behalf of Eulis Foster, MD,as directed by  Eulis Foster, MD while in the presence of Eulis Foster, MD.   Established patient visit   Patient: Jonathon Bell   DOB: 10/19/1951   72 y.o. Male  MRN: [id number] Visit Date: 01/30/2023  Today's healthcare provider: Eulis Foster, MD   Chief Complaint  Patient presents with  . Follow-up   Subjective    HPI  Hypertension, follow-up  BP Readings from Last 3 Encounters:  01/30/23 132/71  07/31/22 (!) 150/79  07/20/22 132/80   Wt Readings from Last 3 Encounters:  01/30/23 (!) 300 lb 6.4 oz (136.3 kg)  07/31/22 284 lb (128.8 kg)  07/20/22 289 lb 9.6 oz (131.4 kg)     He was last seen for hypertension 6 months ago.  BP at that visit was 150/79. Management since that visit includes continue micardis 20mg  daily and amlodipine 10mg  daily .  He reports excellent compliance with treatment.  Outside blood pressures are not being checked.  Symptoms: No chest pain No chest pressure  No palpitations No syncope  No dyspnea No orthopnea  No paroxysmal nocturnal dyspnea No lower extremity edema   Pertinent labs Lab Results  Component Value Date   CHOL 235 (H) 07/31/2022   HDL 43 07/31/2022   LDLCALC 174 (H) 07/31/2022   TRIG 101 07/31/2022   CHOLHDL 5.5 (H) 07/31/2022   Lab Results  Component Value Date   NA 137 07/31/2022   K 4.8 07/31/2022   CREATININE 1.27 07/31/2022   EGFR 61 07/31/2022   GLUCOSE 95 07/31/2022   TSH 2.070 07/19/2021     The 10-year ASCVD risk score (Arnett DK, et al., 2019) is: 25.1%  ---------------------------------------------------------------------------------------------------    HLD  Patient spends time with teen son on the trails hiking  Does not have much energy after work during the week  He is not currently on  cholesterol medication  He states that he has coffee and bagel for breakfast, will have a [place] sandwich for lunch  He states he is unable to eat a burger because he will have hives afterwards (notes that beef and pork will cause him to have hives in the middle of the night so he avoids those)  For dinner, sometimes he will have chicken strips with his daughter, sometimes he will eat a sandwich and other times will skip dinner  Reports that he often drinks water at restaurant, at home he has flavored water and otherwise has just the coffee in the Am, he stopped drinking soft drinks several years ago  He was previously on lipitor 10mg  and had myalgias so he discontinued the medication  States that he has read about the side effects of statin medications and does not wish to add any additional medications    Medications: Outpatient Medications Prior to Visit  Medication Sig  . amLODipine (NORVASC) 10 MG tablet Take 1 tablet (10 mg total) by mouth daily.  . colchicine 0.6 MG tablet   . meloxicam (MOBIC) 15 MG tablet Take 1 tablet (15 mg total) by mouth daily as needed for pain. Take as needed for moderate to severe pain  . telmisartan (MICARDIS) 20 MG tablet Take 1 tablet (20 mg total) by mouth daily.  . traMADol (ULTRAM) 50 MG tablet    No facility-administered medications prior to visit.    Review of Systems     Objective  BP 132/71 (BP Location: Left Arm, Patient Position: Sitting, Cuff Size: Large)   Pulse 61   Temp 97.7 F (36.5 C) (Oral)   Resp 16   Wt (!) 300 lb 6.4 oz (136.3 kg)   BMI 40.74 kg/m    Physical Exam Vitals reviewed.  Constitutional:      General: He is not in acute distress.    Appearance: Normal appearance. He is obese. He is not ill-appearing, toxic-appearing or diaphoretic.  Eyes:     Conjunctiva/sclera: Conjunctivae normal.  Neck:     Vascular: No carotid bruit.  Cardiovascular:     Rate and Rhythm: Normal rate and regular rhythm.     Pulses:  Normal pulses.     Heart sounds: Normal heart sounds. No murmur heard.    No friction rub. No gallop.  Pulmonary:     Effort: Pulmonary effort is normal. No respiratory distress.     Breath sounds: Normal breath sounds. No stridor. No wheezing, rhonchi or rales.  Abdominal:     General: Bowel sounds are normal. There is no distension.     Palpations: Abdomen is soft.     Tenderness: There is no abdominal tenderness.  Musculoskeletal:     Right lower leg: 1+ Pitting Edema present.     Left lower leg: 1+ Pitting Edema present.  Skin:    Findings: No erythema or rash.  Neurological:     Mental Status: He is alert and oriented to person, place, and time.      No results found for any visits on 01/30/23.  Assessment & Plan     Problem List Items Addressed This Visit       Cardiovascular and Mediastinum   Essential (primary) hypertension - Primary    Controlled BP at goal Continue current medications at current doses No medications changes today          Other   HLD (hyperlipidemia)    Chronic  Last lipid panel showed elevated LDL and total cholesterol  Discussed at length with patient today, recommended statin such as crestor as well as zetia  Patient declines stating he will consider starting medication based on risk level of 25.1% that we discussed today for adverse cardiovascular event in next 10 years  Will revisit at follow up in 6 months         Return in about 6 months (around 08/01/2023) for AWV.      The entirety of the information documented in the History of Present Illness, Review of Systems and Physical Exam were personally obtained by me. Portions of this information were initially documented by Lyndel Pleasure, CMA. I, Eulis Foster, MD have reviewed the documentation above for thoroughness and accuracy.   Eulis Foster, MD  [facility] [phone] (phone) [phone] (fax)  [place]

## 2023-01-30 ENCOUNTER — Ambulatory Visit (INDEPENDENT_AMBULATORY_CARE_PROVIDER_SITE_OTHER): Payer: Medicare Other | Admitting: Family Medicine

## 2023-01-30 VITALS — BP 132/71 | HR 61 | Temp 97.7°F | Resp 16 | Wt 300.4 lb

## 2023-01-30 DIAGNOSIS — E7800 Pure hypercholesterolemia, unspecified: Secondary | ICD-10-CM | POA: Diagnosis not present

## 2023-01-30 DIAGNOSIS — I10 Essential (primary) hypertension: Secondary | ICD-10-CM | POA: Diagnosis not present

## 2023-01-30 NOTE — Patient Instructions (Signed)
It was a pleasure to see you today!  Thank you for choosing [facility] for your primary care.   Jonathon Bell was seen for high blood pressure and cholesterol.   Our plans for today were: Your blood pressure is well controlled. Please continue your regimen of amlodipine 10mg  and Micardis 20mg  daily.  We discussed your elevated cholesterol levels in Oct 2023. Your risk of adverse cardiovascular event is 25.1%, which is elevated. Please consider the lifestyle adjustments detailed below to help lower your risk level. We discussed medications such as Crestor, Zetia and Repatha to help with cholesterol in addition to increased physical activity and dietary management.   To keep you healthy, please keep in mind the following health maintenance items that you are due for:   Shingrix Vaccine   Pneumonia Vaccine    You should return to our clinic in 6 months for blood pressure, cholesterol and labs.   Best Wishes,   Dr. Quentin Cornwall    Preventing High Cholesterol Cholesterol is a white, waxy substance similar to fat that the human body needs to help build cells. The liver makes all the cholesterol that a person's body needs. Having high cholesterol (hypercholesterolemia) increases your risk for heart disease and stroke. Extra or excess cholesterol comes from the food that you eat. High cholesterol can often be prevented with diet and lifestyle changes. If you already have high cholesterol, you can control it with diet, lifestyle changes, and medicines. How can high cholesterol affect me? If you have high cholesterol, fatty deposits (plaques) may build up on the walls of your blood vessels. The blood vessels that carry blood away from your heart are called arteries. Plaques make the arteries narrower and stiffer. This in turn can: Restrict or block blood flow and cause blood clots to form. Increase your risk for heart attack and stroke. What can increase my risk for high  cholesterol? This condition is more likely to develop in people who: Eat foods that are high in saturated fat or cholesterol. Saturated fat is mostly found in foods that come from animal sources. Are overweight. Are not getting enough exercise. Use products that contain nicotine or tobacco, such as cigarettes, e-cigarettes, and chewing tobacco. Have a family history of high cholesterol (familial hypercholesterolemia). What actions can I take to prevent this? Nutrition  Eat less saturated fat. Avoid trans fats (partially hydrogenated oils). These are often found in margarine and in some baked goods, fried foods, and snacks bought in packages. Avoid precooked or cured meat, such as bacon, sausages, or meat loaves. Avoid foods and drinks that have added sugars. Eat more fruits, vegetables, and whole grains. Choose healthy sources of protein, such as fish, poultry, lean cuts of red meat, beans, peas, lentils, and nuts. Choose healthy sources of fat, such as: Nuts. Vegetable oils, especially olive oil. Fish that have healthy fats, such as omega-3 fatty acids. These fish include mackerel or salmon. Lifestyle Lose weight if you are overweight. Maintaining a healthy body mass index (BMI) can help prevent or control high cholesterol. It can also lower your risk for diabetes and high blood pressure. Ask your health care provider to help you with a diet and exercise plan to lose weight safely. Do not use any products that contain nicotine or tobacco. These products include cigarettes, chewing tobacco, and vaping devices, such as e-cigarettes. If you need help quitting, ask your health care provider. Alcohol use Do not drink alcohol if: Your health care provider tells you not  to drink. You are pregnant, may be pregnant, or are planning to become pregnant. If you drink alcohol: Limit how much you have to: 0-1 drink a day for women. 0-2 drinks a day for men. Know how much alcohol is in your drink.  In the [place] one drink equals one 12 oz bottle of beer (355 mL), one 5 oz glass of wine (148 mL), or one 1 oz glass of hard liquor (44 mL). Activity  Get enough exercise. Do exercises as told by your health care provider. Each week, do at least 150 minutes of exercise that takes a medium level of effort (moderate-intensity exercise). This kind of exercise: Makes your heart beat faster while allowing you to still be able to talk. Can be done in short sessions several times a day or longer sessions a few times a week. For example, on 5 days each week, you could walk fast or ride your bike 3 times a day for 10 minutes each time. Medicines Your health care provider may recommend medicines to help lower cholesterol. This may be a medicine to lower the amount of cholesterol that your liver makes. You may need medicine if: Diet and lifestyle changes have not lowered your cholesterol enough. You have high cholesterol and other risk factors for heart disease or stroke. Take over-the-counter and prescription medicines only as told by your health care provider. General information Manage your risk factors for high cholesterol. Talk with your health care provider about all your risk factors and how to lower your risk. Manage other conditions that you have, such as diabetes or high blood pressure (hypertension). Have blood tests to check your cholesterol levels at regular points in time as told by your health care provider. Keep all follow-up visits. This is important. Where to find more information American Heart Association: www.heart.org National Heart, Lung, and Blood Institute: https://Dod[employer]-eaton.com/ Summary High cholesterol increases your risk for heart disease and stroke. By keeping your cholesterol level low, you can reduce your risk for these conditions. High cholesterol can often be prevented with diet and lifestyle changes. Work with your health care provider to manage your risk factors, and have  your blood tested regularly. This information is not intended to replace advice given to you by your health care provider. Make sure you discuss any questions you have with your health care provider. Document Revised: 05/19/2022 Document Reviewed: 12/20/2020 Elsevier Patient Education  [place].

## 2023-01-30 NOTE — Assessment & Plan Note (Signed)
Controlled BP at goal Continue current medications at current doses No medications changes today   

## 2023-01-30 NOTE — Assessment & Plan Note (Signed)
Chronic  Last lipid panel showed elevated LDL and total cholesterol  Discussed at length with patient today, recommended statin such as crestor as well as zetia  Patient declines stating he will consider starting medication based on risk level of 25.1% that we discussed today for adverse cardiovascular event in next 10 years  Will revisit at follow up in 6 months

## 2023-03-25 IMAGING — CR DG CHEST 2V
1 series · 2 of 2 positions shown · non-contrast
Comparison: None.

CLINICAL DATA: Back pain

EXAM:
CHEST - 2 VIEW

[Series 1: w chest pa · 0.14mm/px · 2 of 2 slices shown]
[im 1/2]
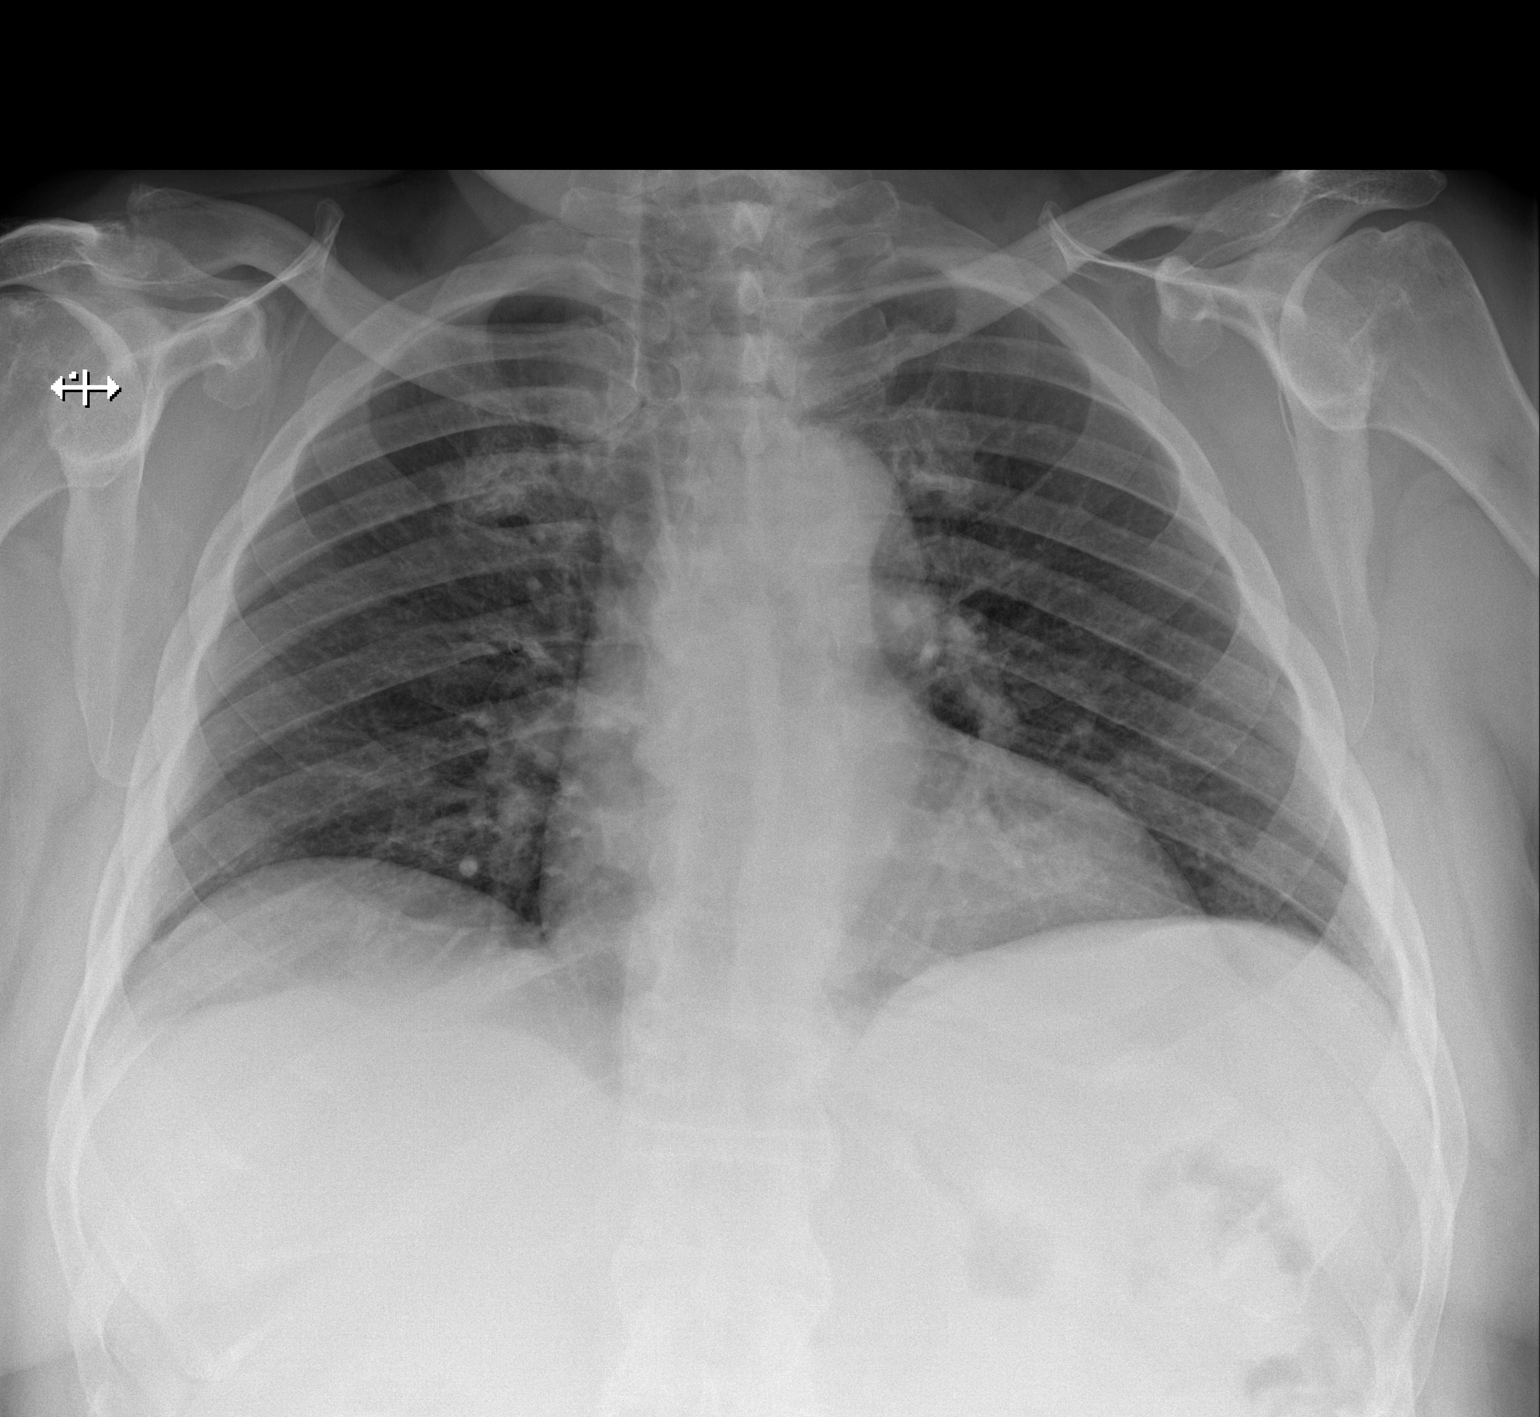
[im 2/2]
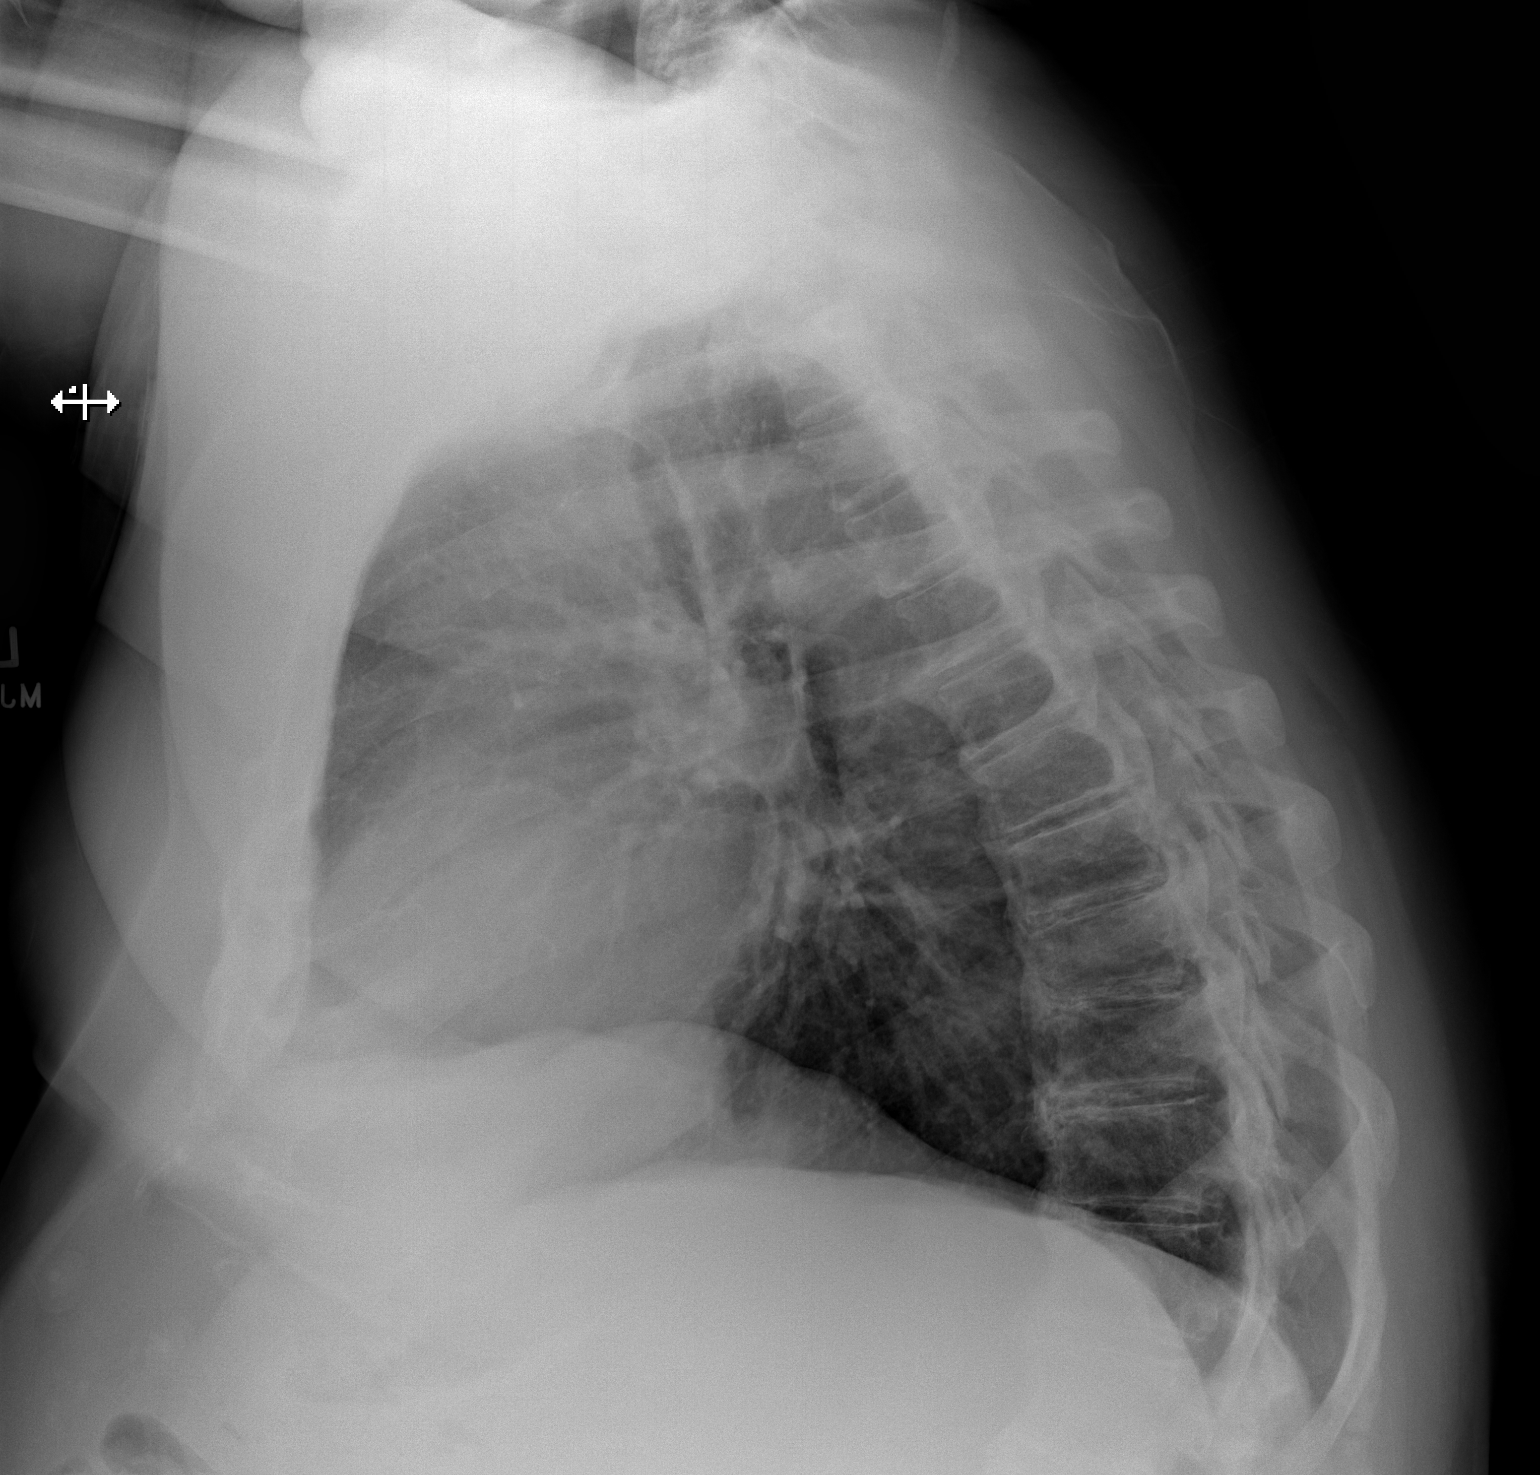

[2 of 2 positions shown; findings below may reference images not displayed]

FINDINGS: The heart size and mediastinal contours are within normal limits.
Both lungs are clear. The visualized skeletal structures are
unremarkable.
IMPRESSION: No active cardiopulmonary disease.

## 2023-07-24 ENCOUNTER — Ambulatory Visit: Payer: Medicare Other

## 2023-07-24 VITALS — BP 157/79 | HR 66 | Ht 72.0 in | Wt 289.7 lb

## 2023-07-24 DIAGNOSIS — Z0000 Encounter for general adult medical examination without abnormal findings: Secondary | ICD-10-CM

## 2023-07-24 DIAGNOSIS — Z0100 Encounter for examination of eyes and vision without abnormal findings: Secondary | ICD-10-CM

## 2023-07-24 NOTE — Progress Notes (Signed)
Subjective:   Jonathon Bell is a 72 y.o. male who presents for Medicare Annual/Subsequent preventive examination.  Visit Complete: In person  Patient Medicare AWV questionnaire was completed by the patient on 07/23/23; I have confirmed that all information answered by patient is correct and no changes since this date.  Cardiac Risk Factors include: advanced age (>7men, >89 women);dyslipidemia;hypertension;obesity (BMI >30kg/m2);male gender     Objective:    Today's Vitals   07/24/23 1011 07/24/23 1014  BP: (!) 167/84 (!) 157/79  Pulse: 66   Weight: 289 lb 11.2 oz (131.4 kg)   Height: 6' (1.829 m)   PainSc:  3    Body mass index is 39.29 kg/m.     07/24/2023   10:27 AM 07/20/2022    1:12 PM 08/08/2021    8:43 AM 08/09/2015    8:12 AM  Advanced Directives  Does Patient Have a Medical Advance Directive? No No No No  Would patient like information on creating a medical advance directive?  No - Patient declined      Current Medications (verified) Outpatient Encounter Medications as of 07/24/2023  Medication Sig   amLODipine (NORVASC) 10 MG tablet Take 1 tablet (10 mg total) by mouth daily.   meloxicam (MOBIC) 15 MG tablet Take 1 tablet (15 mg total) by mouth daily as needed for pain. Take as needed for moderate to severe pain   telmisartan (MICARDIS) 20 MG tablet Take 1 tablet (20 mg total) by mouth daily.   colchicine 0.6 MG tablet  (Patient not taking: Reported on 07/24/2023)   traMADol (ULTRAM) 50 MG tablet  (Patient not taking: Reported on 07/24/2023)   No facility-administered encounter medications on file as of 07/24/2023.    Allergies (verified) Patient has no known allergies.   History: Past Medical History:  Diagnosis Date   Allergy    Arthritis    GERD (gastroesophageal reflux disease)    Hypertension    Past Surgical History:  Procedure Laterality Date   COLONOSCOPY WITH PROPOFOL N/A 08/08/2021   Procedure: COLONOSCOPY WITH PROPOFOL;  Surgeon: Toney Reil, MD;  Location: [facility] ENDOSCOPY;  Service: Gastroenterology;  Laterality: N/A;   FRACTURE SURGERY     arm requiring surgery on tendons   Family History  Problem Relation Age of Onset   Heart disease Mother    Hypertension Mother    Lung cancer Father    Varicose Veins Father    Diabetes Sister    Heart disease Sister    Cirrhosis Sister        non alcoholic   Depression Daughter    Social History   Socioeconomic History   Marital status: Divorced    Spouse name: Not on file   Number of children: Not on file   Years of education: Not on file   Highest education level: Associate degree: occupational, Scientist, product/process development, or vocational program  Occupational History   Not on file  Tobacco Use   Smoking status: Former    Current packs/day: 0.50    Average packs/day: 0.5 packs/day for 6.0 years (3.0 ttl pk-yrs)    Types: Cigarettes   Smokeless tobacco: Former    Quit date: 10/31/1979  Substance and Sexual Activity   Alcohol use: No   Drug use: No   Sexual activity: Not Currently  Other Topics Concern   Not on file  Social History Narrative   Not on file   Social Determinants of Health   Financial Resource Strain: Low Risk  (07/23/2023)  Overall Financial Resource Strain (CARDIA)    Difficulty of Paying Living Expenses: Not hard at all  Food Insecurity: No Food Insecurity (07/23/2023)   Hunger Vital Sign    Worried About Running Out of Food in the Last Year: Never true    Ran Out of Food in the Last Year: Never true  Transportation Needs: No Transportation Needs (07/23/2023)   PRAPARE - Administrator, Civil Service (Medical): No    Lack of Transportation (Non-Medical): No  Physical Activity: Insufficiently Active (07/23/2023)   Exercise Vital Sign    Days of Exercise per Week: 2 days    Minutes of Exercise per Session: 20 min  Stress: No Stress Concern Present (01/30/2023)   [employer] of Occupational Health - Occupational Stress Questionnaire     Feeling of Stress : Only a little  Social Connections: Unknown (07/23/2023)   Social Connection and Isolation Panel [NHANES]    Frequency of Communication with Friends and Family: Once a week    Frequency of Social Gatherings with Friends and Family: More than three times a week    Attends Religious Services: Not on Marketing executive or Organizations: No    Attends Banker Meetings: Never    Marital Status: Divorced    Tobacco Counseling Counseling given: Not Answered   Clinical Intake:  Pre-visit preparation completed: Yes  Pain : 0-10 Pain Score: 3  Pain Type: Chronic pain Pain Location: Shoulder (both knees when ambulating) Pain Orientation: Right Pain Descriptors / Indicators: Aching Pain Onset: More than a month ago Pain Frequency: Constant Pain Relieving Factors: cortisone injections  Pain Relieving Factors: cortisone injections  BMI - recorded: 39.29 Nutritional Status: BMI > 30  Obese Nutritional Risks: None Diabetes: No  How often do you need to have someone help you when you read instructions, pamphlets, or other written materials from your doctor or pharmacy?: 1 - Never  Interpreter Needed?: No  Comments: daughter lives with pt Information entered by :: B.Maureena Dabbs,LPN   Activities of Daily Living    07/23/2023    3:24 PM  In your present state of health, do you have any difficulty performing the following activities:  Hearing? 1  Vision? 1  Difficulty concentrating or making decisions? 0  Walking or climbing stairs? 1  Dressing or bathing? 0  Doing errands, shopping? 0  Preparing Food and eating ? N  Using the Toilet? N  In the past six months, have you accidently leaked urine? N  Do you have problems with loss of bowel control? N  Managing your Medications? N  Managing your Finances? N  Housekeeping or managing your Housekeeping? N    Patient Care Team: Ronnald Ramp, MD as PCP - General (Family  Medicine)  Indicate any recent Medical Services you may have received from other than [facility] providers in the past year (date may be approximate).     Assessment:   This is a routine wellness examination for [name].  Hearing/Vision screen Hearing Screening - Comments:: Pt says he has tinnitus in his ears so diminished hearing Vision Screening - Comments:: Pt says rt eye vision a little diminished:lft eye has good vision No exam in years:referral to [facility]   Goals Addressed             This Visit's Progress    DIET - EAT MORE FRUITS AND VEGETABLES   On track      Depression Screen    07/24/2023  10:27 AM 07/20/2022    1:10 PM 04/18/2022    8:18 AM 11/23/2021    8:19 AM 07/19/2021    9:43 AM 10/31/2017   10:11 AM  PHQ 2/9 Scores  PHQ - 2 Score 0 0 0 0 0 1  PHQ- 9 Score  0 0 0 2 3    Fall Risk    07/23/2023    3:24 PM 07/20/2022    1:13 PM 07/20/2022   12:49 PM 04/18/2022    8:18 AM 11/23/2021    8:19 AM  Fall Risk   Falls in the past year? 0 0 0 0 0  Number falls in past yr: 0 0 0 0 0  Injury with Fall? 0 0 0 0 0  Risk for fall due to : No Fall Risks No Fall Risks  No Fall Risks No Fall Risks  Follow up Falls prevention discussed;Education provided Falls prevention discussed;Falls evaluation completed  Falls evaluation completed Falls evaluation completed    MEDICARE RISK AT HOME: Medicare Risk at Home Any stairs in or around the home?: No If so, are there any without handrails?: No Home free of loose throw rugs in walkways, pet beds, electrical cords, etc?: Yes Adequate lighting in your home to reduce risk of falls?: Yes Life alert?: No Use of a cane, walker or w/c?: No Grab bars in the bathroom?: No Shower chair or bench in shower?: No Elevated toilet seat or a handicapped toilet?: No  TIMED UP AND GO:  Was the test performed?  Yes  Length of time to ambulate 10 feet: 10 sec Gait steady and fast without use of assistive device    Cognitive Function:         07/24/2023   10:28 AM 07/20/2022    1:20 PM  6CIT Screen  What Year? 0 points 0 points  What month? 0 points 0 points  What time? 0 points 0 points  Count back from 20 0 points 0 points  Months in reverse 0 points 0 points  Repeat phrase 0 points 0 points  Total Score 0 points 0 points    Immunizations Immunization History  Administered Date(s) Administered   Td 08/11/2004   Tdap 05/26/2014   Zoster, Live 05/26/2014    TDAP status: Up to date  Flu Vaccine status: Declined, Education has been provided regarding the importance of this vaccine but patient still declined. Advised may receive this vaccine at local pharmacy or Health Dept. Aware to provide a copy of the vaccination record if obtained from local pharmacy or Health Dept. Verbalized acceptance and understanding.  Pneumococcal vaccine status: Declined,  Education has been provided regarding the importance of this vaccine but patient still declined. Advised may receive this vaccine at local pharmacy or Health Dept. Aware to provide a copy of the vaccination record if obtained from local pharmacy or Health Dept. Verbalized acceptance and understanding.   Covid-19 status: done  Qualifies for Shingles Vaccine? Yes   Zostavax completed No   Shingrix Completed?: No.    Education has been provided regarding the importance of this vaccine. Patient has been advised to call insurance company to determine out of pocket expense if they have not yet received this vaccine. Advised may also receive vaccine at local pharmacy or Health Dept. Verbalized acceptance and understanding.  Screening Tests Health Maintenance  Topic Date Due   COVID-19 Vaccine (1 - 2023-24 season) 08/09/2023 (Originally 07/01/2023)   Zoster Vaccines- Shingrix (1 of 2) 10/23/2023 (Originally 10/17/2002)   INFLUENZA  VACCINE  01/28/2024 (Originally 05/31/2023)   Pneumonia Vaccine 61+ Years old (1 of 1 - PCV) 07/23/2024 (Originally 10/17/2017)   DTaP/Tdap/Td (3 - Td  or Tdap) 05/26/2024   Medicare Annual Wellness (AWV)  07/23/2024   Colonoscopy  08/08/2028   Hepatitis C Screening  Completed   HPV VACCINES  Aged Out    Health Maintenance  There are no preventive care reminders to display for this patient.   Colorectal cancer screening: Type of screening: Colonoscopy. Completed yes. Repeat every 5-10 years  Lung Cancer Screening: (Low Dose CT Chest recommended if Age 96-80 years, 20 pack-year currently smoking OR have quit w/in 15years.) does not qualify.   Lung Cancer Screening Referral: no  Additional Screening:  Hepatitis C Screening: does not qualify; Completed yes  Vision Screening: Recommended annual ophthalmology exams for early detection of glaucoma and other disorders of the eye. Is the patient up to date with their annual eye exam?  no Who is the provider or what is the name of the office in which the patient attends annual eye exams? None If pt is not established with a provider, would they like to be referred to a provider to establish care? Yes .   Dental Screening: Recommended annual dental exams for proper oral hygiene  Diabetic Foot Exam: n/a  Community Resource Referral / Chronic Care Management: CRR required this visit?  Yes   CCM required this visit?  No    Plan:     I have personally reviewed and noted the following in the patient's chart:   Medical and social history Use of alcohol, tobacco or illicit drugs  Current medications and supplements including opioid prescriptions. Patient is not currently taking opioid prescriptions. Functional ability and status Nutritional status Physical activity Advanced directives List of other physicians Hospitalizations, surgeries, and ER visits in previous 12 months Vitals Screenings to include cognitive, depression, and falls Referrals and appointments  In addition, I have reviewed and discussed with patient certain preventive protocols, quality metrics, and best  practice recommendations. A written personalized care plan for preventive services as well as general preventive health recommendations were provided to patient.    Sue Lush, LPN   9/60/4540   After Visit Summary: (MyChart) Due to this being a telephonic visit, the after visit summary with patients personalized plan was offered to patient via MyChart   Nurse Notes: The patient states he is doing well and has no concerns or questions at this time.He does request a refill of his medications: Meloxicam, Amlodipine,Telmisartan (he is out of these).

## 2023-07-24 NOTE — Patient Instructions (Signed)
Jonathon Bell , Thank you for taking time to come for your Medicare Wellness Visit. I appreciate your ongoing commitment to your health goals. Please review the following plan we discussed and let me know if I can assist you in the future.   Referrals/Orders/Follow-Ups/Clinician Recommendations: none  This is a list of the screening recommended for you and due dates:  Health Maintenance  Topic Date Due   Zoster (Shingles) Vaccine (1 of 2) 10/17/2002   Pneumonia Vaccine (1 of 1 - PCV) Never done   Flu Shot  Never done   COVID-19 Vaccine (1 - 2023-24 season) Never done   DTaP/Tdap/Td vaccine (3 - Td or Tdap) 05/26/2024   Medicare Annual Wellness Visit  07/23/2024   Colon Cancer Screening  08/08/2028   Hepatitis C Screening  Completed   HPV Vaccine  Aged Out    Advanced directives: (Declined) Advance directive discussed with you today. Even though you declined this today, please call our office should you change your mind, and we can give you the proper paperwork for you to fill out.  Next Medicare Annual Wellness Visit scheduled for next year: Yes

## 2023-08-02 ENCOUNTER — Ambulatory Visit: Payer: Medicare Other | Admitting: Family Medicine

## 2023-08-08 ENCOUNTER — Ambulatory Visit (INDEPENDENT_AMBULATORY_CARE_PROVIDER_SITE_OTHER): Payer: Medicare Other | Admitting: Family Medicine

## 2023-08-08 ENCOUNTER — Encounter: Payer: Self-pay | Admitting: Family Medicine

## 2023-08-08 VITALS — BP 142/80 | HR 60 | Ht 72.0 in | Wt 289.4 lb

## 2023-08-08 DIAGNOSIS — S81801A Unspecified open wound, right lower leg, initial encounter: Secondary | ICD-10-CM | POA: Insufficient documentation

## 2023-08-08 DIAGNOSIS — E7800 Pure hypercholesterolemia, unspecified: Secondary | ICD-10-CM | POA: Diagnosis not present

## 2023-08-08 DIAGNOSIS — I10 Essential (primary) hypertension: Secondary | ICD-10-CM | POA: Diagnosis not present

## 2023-08-08 DIAGNOSIS — S81802A Unspecified open wound, left lower leg, initial encounter: Secondary | ICD-10-CM | POA: Diagnosis not present

## 2023-08-08 MED ORDER — EZETIMIBE 10 MG PO TABS: 10.0000 mg | ORAL_TABLET | Freq: Every day | ORAL | 3 refills | Status: AC

## 2023-08-08 MED ORDER — AMLODIPINE BESYLATE 10 MG PO TABS: 10.0000 mg | ORAL_TABLET | Freq: Every day | ORAL | 3 refills | Status: AC

## 2023-08-08 MED ORDER — TELMISARTAN 20 MG PO TABS: 20.0000 mg | ORAL_TABLET | Freq: Every day | ORAL | 3 refills | Status: AC

## 2023-08-08 NOTE — Assessment & Plan Note (Signed)
Two-week history of a non-healing wound on the leg, no signs of infection. Likely related to venous stasis changes. Acute -Apply Bacitracin or Neosporin and keep wound covered with a bandage. -Review wound healing in 6 weeks.

## 2023-08-08 NOTE — Assessment & Plan Note (Signed)
High risk for heart attack/stroke based on cholesterol numbers, patient previously reluctant to start medication due to side effects.The 10-year ASCVD risk score (Arnett DK, et al., 2019) is: 28.1%  -Start Zetia 10mg  daily to lower cholesterol levels. -Encourage lifestyle modifications including regular exercise and a low cholesterol diet. -Check cholesterol levels in 6 weeks.

## 2023-08-08 NOTE — Patient Instructions (Addendum)
VISIT SUMMARY:  Dear Mr. Cookson, thank you for coming in to discuss your concerns about the lesion on your leg, your hypertension, and your high cholesterol levels. We have developed a plan to address each of these issues.  YOUR PLAN:  -HYPERTENSION: Hypertension, or high blood pressure, can lead to serious health problems if not managed. We will refill your prescriptions for Telmisartan 20mg  and Amlodipine 10mg , which are medications that help lower your blood pressure. We will check your blood pressure again in 6 weeks to see how you are doing.  -HYPERLIPIDEMIA: Hyperlipidemia, or high cholesterol, increases your risk for heart attack and stroke. We will start you on a non-statin medication, Zetia 10mg , to help lower your cholesterol levels. It's also important to exercise regularly and eat a diet low in cholesterol. We will check your cholesterol levels again in 6 weeks.  -LEG WOUND: Your leg wound, which has not been healing well, is likely related to changes in your veins. You should apply Neosporin at night and cover with bandage while at work and while sleeping. We will review the healing process in 6 weeks.  INSTRUCTIONS:  Please make sure to refill your prescriptions for Telmisartan 20mg  and Amlodipine 10mg , and start taking Zetia 10mg  daily. Apply Bacitracin or neosporin to your leg wound and keep it covered with a bandage. Remember to exercise regularly and eat a diet low in cholesterol. We will check your blood pressure, cholesterol levels, and the healing of your leg wound in 6 weeks.

## 2023-08-08 NOTE — Assessment & Plan Note (Signed)
Elevated blood pressure readings, patient has been out of medication for a while. Chronic -Refill Telmisartan 20mg  and Amlodipine 10mg . -Check blood pressure in 6 weeks. -will need CMP at next visit

## 2023-08-08 NOTE — Progress Notes (Signed)
Established patient visit   Patient: Jonathon Bell   DOB: [birth year]   72 y.o. Male  MRN: [id number] Visit Date: 08/08/2023  Today's healthcare provider: Ronnald Ramp, MD   Chief Complaint  Patient presents with   Medical Management of Chronic Issues    6 month follow up on high cholesterol, Would like to discuss a place on the right lower leg that wont heal, previous treatment has been peroxide, alcohol, and neosporin. Has been a problem for atleast 2 weeks, maybe longer    Medication Refill    Meloxicam, amlodipine, telmisartan    Subjective     HPI     Medical Management of Chronic Issues    Additional comments: 6 month follow up on high cholesterol, Would like to discuss a place on the right lower leg that wont heal, previous treatment has been peroxide, alcohol, and neosporin. Has been a problem for atleast 2 weeks, maybe longer         Medication Refill    Additional comments: Meloxicam, amlodipine, telmisartan       Last edited by Rolly Salter, CMA on 08/08/2023  8:18 AM.       Discussed the use of AI scribe software for clinical note transcription with the patient, who gave verbal consent to proceed.  History of Present Illness   Jonathon Bell, a patient with a history of hypertension and hypercholesterolemia, presents with concerns about an unexplained lesion on his leg that has persisted for approximately two weeks. The patient does not recall any specific injury to the area but notes that the lesion scabs over, itches intensely, and reopens with showering. Despite attempts at self-treatment with alcohol, peroxide, and Neosporin, the lesion has not improved significantly. The patient also reports varicose veins and wonders if poor circulation could be contributing to the issue.  In addition to the leg lesion, Jonathon Bell reports running out of his antihypertensive medications, telmisartan 20mg  and amlodipine 10mg , which he has not refilled for a  year. He also mentions a recent wellness visit where his blood pressure was noted to be elevated, and he expresses anxiety about his blood pressure readings during medical visits.  Regarding his hypercholesterolemia, Jonathon Bell expresses reluctance to start any cholesterol-lowering medications due to previous experiences of leg pain with such treatments. He does not recall the specific medication he previously tried. Despite his hesitations, he is open to trying a non-statin medication, Zetia 10mg , to help lower his cholesterol levels.         Past Medical History:  Diagnosis Date   Allergy    Arthritis    GERD (gastroesophageal reflux disease)    Hypertension     Medications: Outpatient Medications Prior to Visit  Medication Sig   colchicine 0.6 MG tablet    meloxicam (MOBIC) 15 MG tablet Take 1 tablet (15 mg total) by mouth daily as needed for pain. Take as needed for moderate to severe pain   [DISCONTINUED] amLODipine (NORVASC) 10 MG tablet Take 1 tablet (10 mg total) by mouth daily.   [DISCONTINUED] telmisartan (MICARDIS) 20 MG tablet Take 1 tablet (20 mg total) by mouth daily.   [DISCONTINUED] traMADol (ULTRAM) 50 MG tablet  (Patient not taking: Reported on 07/24/2023)   No facility-administered medications prior to visit.    Review of Systems  Last metabolic panel Lab Results  Component Value Date   GLUCOSE 95 07/31/2022   NA 137 07/31/2022   K 4.8 07/31/2022   CL 100  07/31/2022   CO2 22 07/31/2022   BUN 16 07/31/2022   CREATININE 1.27 07/31/2022   EGFR 61 07/31/2022   CALCIUM 9.4 07/31/2022   PROT 7.3 07/31/2022   ALBUMIN 4.8 07/31/2022   LABGLOB 2.5 07/31/2022   AGRATIO 1.9 07/31/2022   BILITOT 0.8 07/31/2022   ALKPHOS 73 07/31/2022   AST 24 07/31/2022   ALT 23 07/31/2022   ANIONGAP 8 06/29/2021   Last lipids Lab Results  Component Value Date   CHOL 235 (H) 07/31/2022   HDL 43 07/31/2022   LDLCALC 174 (H) 07/31/2022   TRIG 101 07/31/2022   CHOLHDL 5.5  (H) 07/31/2022   Last hemoglobin A1c No results found for: "HGBA1C"      Objective    BP (!) 142/80   Pulse 60   Ht 6' (1.829 m)   Wt 289 lb 6.4 oz (131.3 kg)   SpO2 100%   BMI 39.25 kg/m    BP Readings from Last 3 Encounters:  08/08/23 (!) 142/80  07/24/23 (!) 157/79  01/30/23 132/71   Wt Readings from Last 3 Encounters:  08/08/23 289 lb 6.4 oz (131.3 kg)  07/24/23 289 lb 11.2 oz (131.4 kg)  01/30/23 (!) 300 lb 6.4 oz (136.3 kg)       Physical Exam Vitals reviewed.  Constitutional:      General: He is not in acute distress.    Appearance: Normal appearance. He is not ill-appearing, toxic-appearing or diaphoretic.  Eyes:     Conjunctiva/sclera: Conjunctivae normal.  Cardiovascular:     Rate and Rhythm: Normal rate and regular rhythm.     Pulses: Normal pulses.     Heart sounds: Normal heart sounds. No murmur heard.    No friction rub. No gallop.  Pulmonary:     Effort: Pulmonary effort is normal. No respiratory distress.     Breath sounds: Normal breath sounds. No stridor. No wheezing, rhonchi or rales.  Abdominal:     General: Bowel sounds are normal. There is no distension.     Palpations: Abdomen is soft.     Tenderness: There is no abdominal tenderness.  Musculoskeletal:     Right lower leg: No edema.     Left lower leg: No edema.  Skin:    Findings: Abrasion, rash and wound present. No abscess or erythema.     Comments: Area on lateral left lower extremity consistent with abrasion, not actively bleeding nor draining purulent fluid    Neurological:     Mental Status: He is alert and oriented to person, place, and time.       No results found for any visits on 08/08/23.  Assessment & Plan     Problem List Items Addressed This Visit     Essential (primary) hypertension    Elevated blood pressure readings, patient has been out of medication for a while. Chronic -Refill Telmisartan 20mg  and Amlodipine 10mg . -Check blood pressure in 6  weeks. -will need CMP at next visit       Relevant Medications   telmisartan (MICARDIS) 20 MG tablet   amLODipine (NORVASC) 10 MG tablet   ezetimibe (ZETIA) 10 MG tablet   HLD (hyperlipidemia) - Primary    High risk for heart attack/stroke based on cholesterol numbers, patient previously reluctant to start medication due to side effects.The 10-year ASCVD risk score (Arnett DK, et al., 2019) is: 28.1%  -Start Zetia 10mg  daily to lower cholesterol levels. -Encourage lifestyle modifications including regular exercise and a low cholesterol diet. -Check cholesterol  levels in 6 weeks.      Relevant Medications   telmisartan (MICARDIS) 20 MG tablet   amLODipine (NORVASC) 10 MG tablet   ezetimibe (ZETIA) 10 MG tablet   Wound of right leg    Two-week history of a non-healing wound on the leg, no signs of infection. Likely related to venous stasis changes. Acute -Apply Bacitracin or Neosporin and keep wound covered with a bandage. -Review wound healing in 6 weeks.              Return in about 6 weeks (around 09/19/2023) for HTN, Cholesterol.       Ronnald Ramp, MD  [facility] [phone] (phone) [phone] (fax)  [facility]

## 2023-09-12 ENCOUNTER — Encounter: Payer: Self-pay | Admitting: Ophthalmology

## 2023-09-18 NOTE — Discharge Instructions (Signed)

## 2023-09-18 NOTE — Anesthesia Preprocedure Evaluation (Signed)
Anesthesia Evaluation  Patient identified by MRN, date of birth, ID band Patient awake    Reviewed: Allergy & Precautions, H&P , NPO status , Patient's Chart, lab work & pertinent test results  Airway Mallampati: I  TM Distance: >3 FB Neck ROM: Full    Dental no notable dental hx.    Pulmonary former smoker   Pulmonary exam normal breath sounds clear to auscultation       Cardiovascular hypertension, Normal cardiovascular exam Rhythm:Regular Rate:Normal     Neuro/Psych  Neuromuscular disease  negative psych ROS   GI/Hepatic Neg liver ROS, hiatal hernia,GERD  ,,  Endo/Other  negative endocrine ROS    Renal/GU negative Renal ROS  negative genitourinary   Musculoskeletal  (+) Arthritis ,    Abdominal   Peds negative pediatric ROS (+)  Hematology negative hematology ROS (+)   Anesthesia Other Findings Hypertension Allergy Arthritis  GERD (gastroesophageal reflux disease)    Reproductive/Obstetrics negative OB ROS                              Anesthesia Physical Anesthesia Plan  ASA: 2  Anesthesia Plan: MAC   Post-op Pain Management:    Induction: Intravenous  PONV Risk Score and Plan:   Airway Management Planned: Natural Airway and Nasal Cannula  Additional Equipment:   Intra-op Plan:   Post-operative Plan:   Informed Consent: I have reviewed the patients History and Physical, chart, labs and discussed the procedure including the risks, benefits and alternatives for the proposed anesthesia with the patient or authorized representative who has indicated his/her understanding and acceptance.     Dental Advisory Given  Plan Discussed with: Anesthesiologist, CRNA and Surgeon  Anesthesia Plan Comments: (Patient consented for risks of anesthesia including but not limited to:  - adverse reactions to medications - damage to eyes, teeth, lips or other oral mucosa - nerve  damage due to positioning  - sore throat or hoarseness - Damage to heart, brain, nerves, lungs, other parts of body or loss of life  Patient voiced understanding and assent.)         Anesthesia Quick Evaluation

## 2023-09-19 ENCOUNTER — Encounter
Admission: RE | Disposition: A | Discharge status: Home / Self Care | Payer: Self-pay | Source: Home / Self Care | Attending: Ophthalmology

## 2023-09-19 ENCOUNTER — Encounter: Payer: Self-pay | Admitting: Ophthalmology

## 2023-09-19 ENCOUNTER — Ambulatory Visit
Admission: RE | Admit: 2023-09-19 | Discharge: 2023-09-19 | Disposition: A | Discharge status: Home / Self Care | Payer: Medicare Other | Attending: Ophthalmology | Admitting: Ophthalmology

## 2023-09-19 ENCOUNTER — Ambulatory Visit: Payer: Self-pay | Admitting: Anesthesiology

## 2023-09-19 ENCOUNTER — Ambulatory Visit: Payer: Medicare Other | Admitting: Family Medicine

## 2023-09-19 ENCOUNTER — Other Ambulatory Visit: Payer: Self-pay

## 2023-09-19 DIAGNOSIS — Z87891 Personal history of nicotine dependence: Secondary | ICD-10-CM | POA: Insufficient documentation

## 2023-09-19 DIAGNOSIS — H2511 Age-related nuclear cataract, right eye: Secondary | ICD-10-CM | POA: Diagnosis present

## 2023-09-19 DIAGNOSIS — I10 Essential (primary) hypertension: Secondary | ICD-10-CM | POA: Diagnosis not present

## 2023-09-19 HISTORY — PX: CATARACT EXTRACTION W/PHACO: SHX586

## 2023-09-19 SURGERY — PHACOEMULSIFICATION, CATARACT, WITH IOL INSERTION
Anesthesia: Monitor Anesthesia Care | Site: Eye | Laterality: Right

## 2023-09-19 MED ORDER — SIGHTPATH DOSE#1 BSS IO SOLN
INTRAOCULAR | Status: DC | PRN
  Administered 2023-09-19: 15 mL

## 2023-09-19 MED ORDER — BRIMONIDINE TARTRATE-TIMOLOL 0.2-0.5 % OP SOLN
OPHTHALMIC | Status: DC | PRN
  Administered 2023-09-19: 1 [drp] via OPHTHALMIC

## 2023-09-19 MED ORDER — TETRACAINE HCL 0.5 % OP SOLN
1.0000 [drp] | OPHTHALMIC | Status: DC | PRN
  Administered 2023-09-19 (×3): 1 [drp] via OPHTHALMIC

## 2023-09-19 MED ORDER — FENTANYL CITRATE (PF) 100 MCG/2ML IJ SOLN
INTRAMUSCULAR | Status: AC
  Filled 2023-09-19: qty 2

## 2023-09-19 MED ORDER — ARMC OPHTHALMIC DILATING DROPS
OPHTHALMIC | Status: AC
  Filled 2023-09-19: qty 0.5

## 2023-09-19 MED ORDER — ARMC OPHTHALMIC DILATING DROPS
1.0000 | OPHTHALMIC | Status: DC | PRN
Start: 2023-09-19 — End: 2023-09-19
  Administered 2023-09-19 (×3): 1 via OPHTHALMIC

## 2023-09-19 MED ORDER — SIGHTPATH DOSE#1 BSS IO SOLN
INTRAOCULAR | Status: DC | PRN
  Administered 2023-09-19: 1 mL via INTRAMUSCULAR

## 2023-09-19 MED ORDER — TETRACAINE HCL 0.5 % OP SOLN
OPHTHALMIC | Status: AC
  Filled 2023-09-19: qty 4

## 2023-09-19 MED ORDER — MIDAZOLAM HCL 2 MG/2ML IJ SOLN
INTRAMUSCULAR | Status: AC
Start: 2023-09-19 — End: ?
  Filled 2023-09-19: qty 2

## 2023-09-19 MED ORDER — NEOMYCIN-POLYMYXIN-DEXAMETH 3.5-10000-0.1 OP OINT
TOPICAL_OINTMENT | OPHTHALMIC | Status: DC | PRN
  Administered 2023-09-19: 1 via OPHTHALMIC

## 2023-09-19 MED ORDER — FENTANYL CITRATE (PF) 100 MCG/2ML IJ SOLN
INTRAMUSCULAR | Status: DC | PRN
  Administered 2023-09-19: 50 ug via INTRAVENOUS

## 2023-09-19 MED ORDER — SIGHTPATH DOSE#1 BSS IO SOLN
INTRAOCULAR | Status: DC | PRN
  Administered 2023-09-19: 74 mL via OPHTHALMIC

## 2023-09-19 MED ORDER — MOXIFLOXACIN HCL 0.5 % OP SOLN: OPHTHALMIC | Status: DC | PRN

## 2023-09-19 MED ORDER — CEFUROXIME OPHTHALMIC INJECTION 1 MG/0.1 ML
INJECTION | OPHTHALMIC | Status: DC | PRN
  Administered 2023-09-19: .1 mL via INTRACAMERAL

## 2023-09-19 MED ORDER — SIGHTPATH DOSE#1 NA HYALUR & NA CHOND-NA HYALUR IO KIT
PACK | INTRAOCULAR | Status: DC | PRN
  Administered 2023-09-19: 1 via OPHTHALMIC

## 2023-09-19 MED ORDER — MIDAZOLAM HCL 2 MG/2ML IJ SOLN
INTRAMUSCULAR | Status: DC | PRN
  Administered 2023-09-19: 2 mg via INTRAVENOUS

## 2023-09-19 SURGICAL SUPPLY — 9 items
CATARACT SUITE SIGHTPATH (MISCELLANEOUS) ×1
FEE CATARACT SUITE SIGHTPATH (MISCELLANEOUS) ×1 IMPLANT
GLOVE SRG 8 PF TXTR STRL LF DI (GLOVE) ×1 IMPLANT
GLOVE SURG ENC TEXT LTX SZ7.5 (GLOVE) ×1 IMPLANT
LENS CLAREON VIVITY 20 ×1 IMPLANT
LENS IOL CLRN VT YLW 20.0 IMPLANT
NDL FILTER BLUNT 18X1 1/2 (NEEDLE) ×1 IMPLANT
NEEDLE FILTER BLUNT 18X1 1/2 (NEEDLE) ×1
SYR 3ML LL SCALE MARK (SYRINGE) ×1 IMPLANT

## 2023-09-19 NOTE — Op Note (Signed)
  LOCATION:  [facility]   PREOPERATIVE DIAGNOSIS:    Nuclear sclerotic cataract right eye. H25.11   POSTOPERATIVE DIAGNOSIS:  Nuclear sclerotic cataract right eye.     PROCEDURE:  Phacoemusification with posterior chamber intraocular lens placement of the right eye   ULTRASOUND TIME: Procedure(s): CATARACT EXTRACTION PHACO AND INTRAOCULAR LENS PLACEMENT (IOC) RIGHT  CLAREON VIVITY LENS  5.96  00:31.9 (Right)  LENS:   Implant Name Type Inv. Item Serial No. Manufacturer Lot No. LRB No. Used Action  LENS CLAREON VIVITY 20 - [id number]  LENS CLAREON VIVITY 20 [id number] [employer]IGHT[employer]  Right 1 Implanted      CNWET0 20.0   SURGEON:  Deirdre Evener, MD   ANESTHESIA:  Topical with tetracaine drops and 2% Xylocaine jelly, augmented with 1% preservative-free intracameral lidocaine.    COMPLICATIONS:  None.   DESCRIPTION OF PROCEDURE:  The patient was identified in the holding room and transported to the operating room and placed in the supine position under the operating microscope.  The right eye was identified as the operative eye and it was prepped and draped in the usual sterile ophthalmic fashion.   A 1 millimeter clear-corneal paracentesis was made at the 12:00 position.  0.5 ml of preservative-free 1% lidocaine was injected into the anterior chamber. The anterior chamber was filled with Viscoat viscoelastic.  A 2.4 millimeter keratome was used to make a near-clear corneal incision at the 9:00 position.  A curvilinear capsulorrhexis was made with a cystotome and capsulorrhexis forceps.  Balanced salt solution was used to hydrodissect and hydrodelineate the nucleus.   Phacoemulsification was then used in stop and chop fashion to remove the lens nucleus and epinucleus.  The remaining cortex was then removed using the irrigation and aspiration handpiece. Provisc was then placed into the capsular bag to distend it for lens placement.  A lens was then injected into the capsular  bag.  The remaining viscoelastic was aspirated.   Wounds were hydrated with balanced salt solution.  The anterior chamber was inflated to a physiologic pressure with balanced salt solution.  No wound leaks were noted. Cefuroxime 0.1 ml of a 10mg /ml solution was injected into the anterior chamber for a dose of 1 mg of intracameral antibiotic at the completion of the case.   Timolol and Brimonidine drops and Maxitrol ointment were applied to the eye.  The patient was taken to the recovery room in stable condition without complications of anesthesia or surgery.   Shaw Dobek 09/19/2023, 10:23 AM

## 2023-09-19 NOTE — Transfer of Care (Signed)
Immediate Anesthesia Transfer of Care Note  Patient: Jonathon Bell  Procedure(s) Performed: CATARACT EXTRACTION PHACO AND INTRAOCULAR LENS PLACEMENT (IOC) RIGHT  CLAREON VIVITY LENS  5.96  00:31.9 (Right: Eye)  Patient Location: PACU  Anesthesia Type: MAC  Level of Consciousness: awake, alert  and patient cooperative  Airway and Oxygen Therapy: Patient Spontanous Breathing and Patient connected to supplemental oxygen  Post-op Assessment: Post-op Vital signs reviewed, Patient's Cardiovascular Status Stable, Respiratory Function Stable, Patent Airway and No signs of Nausea or vomiting  Post-op Vital Signs: Reviewed and stable  Complications: No notable events documented.

## 2023-09-19 NOTE — Anesthesia Postprocedure Evaluation (Signed)
Anesthesia Post Note  Patient: Jonathon Bell  Procedure(s) Performed: CATARACT EXTRACTION PHACO AND INTRAOCULAR LENS PLACEMENT (IOC) RIGHT  CLAREON VIVITY LENS  5.96  00:31.9 (Right: Eye)  Anesthesia Type: MAC Anesthetic complications: no   No notable events documented.   Last Vitals:  Vitals:   09/19/23 1023 09/19/23 1028  BP: 116/80   Pulse: 67   Resp: 20   Temp: (!) 36.1 C 36.6 C  SpO2: 97%     Last Pain:  Vitals:   09/19/23 1028  TempSrc:   PainSc: 0-No pain                 Jeanna Giuffre C Idris Edmundson

## 2023-09-19 NOTE — H&P (Signed)
[facility]   Primary Care Physician:  Ronnald Ramp, MD Ophthalmologist: Dr. Lockie Mola  Pre-Procedure History & Physical: HPI:  Jonathon Bell is a 72 y.o. male here for ophthalmic surgery.   Past Medical History:  Diagnosis Date   Allergy    Arthritis    GERD (gastroesophageal reflux disease)    Hypertension     Past Surgical History:  Procedure Laterality Date   COLONOSCOPY WITH PROPOFOL N/A 08/08/2021   Procedure: COLONOSCOPY WITH PROPOFOL;  Surgeon: Toney Reil, MD;  Location: [facility] ENDOSCOPY;  Service: Gastroenterology;  Laterality: N/A;   FRACTURE SURGERY     arm requiring surgery on tendons    Prior to Admission medications   Medication Sig Start Date End Date Taking? Authorizing Provider  amLODipine (NORVASC) 10 MG tablet Take 1 tablet (10 mg total) by mouth daily. 08/08/23  Yes Simmons-Robinson, Makiera, MD  ezetimibe (ZETIA) 10 MG tablet Take 1 tablet (10 mg total) by mouth daily. 08/08/23  Yes Simmons-Robinson, Makiera, MD  meloxicam (MOBIC) 15 MG tablet Take 1 tablet (15 mg total) by mouth daily as needed for pain. Take as needed for moderate to severe pain 04/18/22  Yes Bosie Clos, MD  telmisartan (MICARDIS) 20 MG tablet Take 1 tablet (20 mg total) by mouth daily. 08/08/23  Yes Simmons-Robinson, Makiera, MD  colchicine 0.6 MG tablet  12/11/21   [provider]    Allergies as of 08/28/2023   (No Known Allergies)    Family History  Problem Relation Age of Onset   Heart disease Mother    Hypertension Mother    Lung cancer Father    Varicose Veins Father    Diabetes Sister    Heart disease Sister    Cirrhosis Sister        non alcoholic   Depression Daughter     Social History   Socioeconomic History   Marital status: Divorced    Spouse name: Not on file   Number of children: Not on file   Years of education: Not on file   Highest education level: Associate degree: occupational, Scientist, product/process development, or  vocational program  Occupational History   Not on file  Tobacco Use   Smoking status: Former    Current packs/day: 0.50    Average packs/day: 0.5 packs/day for 6.0 years (3.0 ttl pk-yrs)    Types: Cigarettes   Smokeless tobacco: Former    Quit date: 10/31/1979  Substance and Sexual Activity   Alcohol use: No   Drug use: No   Sexual activity: Not Currently  Other Topics Concern   Not on file  Social History Narrative   Not on file   Social Determinants of Health   Financial Resource Strain: Low Risk  (07/23/2023)   Overall Financial Resource Strain (CARDIA)    Difficulty of Paying Living Expenses: Not hard at all  Food Insecurity: No Food Insecurity (07/23/2023)   Hunger Vital Sign    Worried About Running Out of Food in the Last Year: Never true    Ran Out of Food in the Last Year: Never true  Transportation Needs: No Transportation Needs (07/23/2023)   PRAPARE - Administrator, Civil Service (Medical): No    Lack of Transportation (Non-Medical): No  Physical Activity: Insufficiently Active (07/23/2023)   Exercise Vital Sign    Days of Exercise per Week: 2 days    Minutes of Exercise per Session: 20 min  Stress: No Stress Concern Present (01/30/2023)   [place]  [employer]stitute of Occupational Health - Occupational Stress Questionnaire    Feeling of Stress : Only a little  Social Connections: Unknown (07/23/2023)   Social Connection and Isolation Panel [NHANES]    Frequency of Communication with Friends and Family: Once a week    Frequency of Social Gatherings with Friends and Family: More than three times a week    Attends Religious Services: Not on Marketing executive or Organizations: No    Attends Banker Meetings: Never    Marital Status: Divorced  Catering manager Violence: Not At Risk (07/24/2023)   Humiliation, Afraid, Rape, and Kick questionnaire    Fear of Current or Ex-Partner: No    Emotionally Abused: No    Physically Abused: No     Sexually Abused: No    Review of Systems: See HPI, otherwise negative ROS  Physical Exam: Ht 6' (1.829 m)   Wt 129.3 kg   BMI 38.65 kg/m  General:   Alert,  pleasant and cooperative in NAD Head:  Normocephalic and atraumatic. Lungs:  Clear to auscultation.    Heart:  Regular rate and rhythm.   Impression/Plan: Jonathon Bell is here for ophthalmic surgery.  Risks, benefits, limitations, and alternatives regarding ophthalmic surgery have been reviewed with the patient.  Questions have been answered.  All parties agreeable.   Lockie Mola, MD  09/19/2023, 9:08 AM

## 2023-09-20 ENCOUNTER — Encounter: Payer: Self-pay | Admitting: Ophthalmology

## 2023-10-01 NOTE — Discharge Instructions (Signed)

## 2023-10-03 ENCOUNTER — Ambulatory Visit
Admission: RE | Admit: 2023-10-03 | Discharge: 2023-10-03 | Disposition: A | Discharge status: Home / Self Care | Payer: Medicare Other | Attending: Ophthalmology | Admitting: Ophthalmology

## 2023-10-03 ENCOUNTER — Encounter: Payer: Self-pay | Admitting: Ophthalmology

## 2023-10-03 ENCOUNTER — Other Ambulatory Visit: Payer: Self-pay

## 2023-10-03 ENCOUNTER — Encounter
Admission: RE | Disposition: A | Discharge status: Home / Self Care | Payer: Self-pay | Source: Home / Self Care | Attending: Ophthalmology

## 2023-10-03 ENCOUNTER — Ambulatory Visit: Payer: Medicare Other | Admitting: Registered Nurse

## 2023-10-03 DIAGNOSIS — Z79899 Other long term (current) drug therapy: Secondary | ICD-10-CM | POA: Insufficient documentation

## 2023-10-03 DIAGNOSIS — H2512 Age-related nuclear cataract, left eye: Secondary | ICD-10-CM | POA: Insufficient documentation

## 2023-10-03 DIAGNOSIS — Z87891 Personal history of nicotine dependence: Secondary | ICD-10-CM | POA: Diagnosis not present

## 2023-10-03 DIAGNOSIS — I10 Essential (primary) hypertension: Secondary | ICD-10-CM | POA: Insufficient documentation

## 2023-10-03 HISTORY — PX: CATARACT EXTRACTION W/PHACO: SHX586

## 2023-10-03 SURGERY — PHACOEMULSIFICATION, CATARACT, WITH IOL INSERTION
Anesthesia: Monitor Anesthesia Care | Laterality: Left

## 2023-10-03 MED ORDER — CEFUROXIME OPHTHALMIC INJECTION 1 MG/0.1 ML
INJECTION | OPHTHALMIC | Status: DC | PRN
  Administered 2023-10-03: 1 mg via INTRACAMERAL

## 2023-10-03 MED ORDER — TETRACAINE HCL 0.5 % OP SOLN
1.0000 [drp] | OPHTHALMIC | Status: DC | PRN
Start: 2023-10-03 — End: 2023-10-03
  Administered 2023-10-03 (×2): 1 [drp] via OPHTHALMIC

## 2023-10-03 MED ORDER — SODIUM CHLORIDE 0.9% FLUSH: 10.0000 mL | Freq: Two times a day (BID) | INTRAVENOUS | Status: DC

## 2023-10-03 MED ORDER — MIDAZOLAM HCL 2 MG/2ML IJ SOLN
INTRAMUSCULAR | Status: AC
Start: 2023-10-03 — End: ?
  Filled 2023-10-03: qty 2

## 2023-10-03 MED ORDER — ARMC OPHTHALMIC DILATING DROPS
1.0000 | OPHTHALMIC | Status: DC | PRN
Start: 2023-10-03 — End: 2023-10-03
  Administered 2023-10-03 (×3): 1 via OPHTHALMIC

## 2023-10-03 MED ORDER — SIGHTPATH DOSE#1 BSS IO SOLN
INTRAOCULAR | Status: DC | PRN
  Administered 2023-10-03: 15 mL via INTRAOCULAR

## 2023-10-03 MED ORDER — FENTANYL CITRATE (PF) 100 MCG/2ML IJ SOLN
INTRAMUSCULAR | Status: AC
  Filled 2023-10-03: qty 2

## 2023-10-03 MED ORDER — TETRACAINE HCL 0.5 % OP SOLN
OPHTHALMIC | Status: AC
  Filled 2023-10-03: qty 4

## 2023-10-03 MED ORDER — SIGHTPATH DOSE#1 NA HYALUR & NA CHOND-NA HYALUR IO KIT
PACK | INTRAOCULAR | Status: DC | PRN
  Administered 2023-10-03: 1 via OPHTHALMIC

## 2023-10-03 MED ORDER — SIGHTPATH DOSE#1 BSS IO SOLN
INTRAOCULAR | Status: DC | PRN
  Administered 2023-10-03: 2 mL

## 2023-10-03 MED ORDER — MIDAZOLAM HCL 2 MG/2ML IJ SOLN
INTRAMUSCULAR | Status: DC | PRN
  Administered 2023-10-03: 2 mg via INTRAVENOUS

## 2023-10-03 MED ORDER — BRIMONIDINE TARTRATE-TIMOLOL 0.2-0.5 % OP SOLN
OPHTHALMIC | Status: DC | PRN
  Administered 2023-10-03: 1 [drp] via OPHTHALMIC

## 2023-10-03 MED ORDER — FENTANYL CITRATE (PF) 100 MCG/2ML IJ SOLN
INTRAMUSCULAR | Status: DC | PRN
  Administered 2023-10-03 (×2): 50 ug via INTRAVENOUS

## 2023-10-03 MED ORDER — SIGHTPATH DOSE#1 BSS IO SOLN
INTRAOCULAR | Status: DC | PRN
  Administered 2023-10-03: 55 mL via OPHTHALMIC

## 2023-10-03 SURGICAL SUPPLY — 10 items
CATARACT SUITE SIGHTPATH (MISCELLANEOUS) ×1
FEE CATARACT SUITE SIGHTPATH (MISCELLANEOUS) ×1 IMPLANT
GLOVE SRG 8 PF TXTR STRL LF DI (GLOVE) ×1 IMPLANT
GLOVE SURG ENC TEXT LTX SZ7.5 (GLOVE) ×1 IMPLANT
LENS CLAREON VIVITY 20 ×1 IMPLANT
LENS IOL CLRN VT YLW 20.0 IMPLANT
NDL FILTER BLUNT 18X1 1/2 (NEEDLE) ×1 IMPLANT
NEEDLE FILTER BLUNT 18X1 1/2 (NEEDLE) ×1
SYR 3ML LL SCALE MARK (SYRINGE) ×1 IMPLANT
TIP IRRIGATON/ASPIRATION (MISCELLANEOUS) IMPLANT

## 2023-10-03 NOTE — H&P (Signed)
[facility]   Primary Care Physician:  Ronnald Ramp, MD Ophthalmologist: Dr. Lockie Mola  Pre-Procedure History & Physical: HPI:  Jonathon Bell is a 72 y.o. male here for ophthalmic surgery.   Past Medical History:  Diagnosis Date   Allergy    Arthritis    GERD (gastroesophageal reflux disease)    Hypertension     Past Surgical History:  Procedure Laterality Date   CATARACT EXTRACTION W/PHACO Right 09/19/2023   Procedure: CATARACT EXTRACTION PHACO AND INTRAOCULAR LENS PLACEMENT (IOC) RIGHT  CLAREON VIVITY LENS  5.96  00:31.9;  Surgeon: Lockie Mola, MD;  Location: [facility];  Service: Ophthalmology;  Laterality: Right;   COLONOSCOPY WITH PROPOFOL N/A 08/08/2021   Procedure: COLONOSCOPY WITH PROPOFOL;  Surgeon: Toney Reil, MD;  Location: [facility] ENDOSCOPY;  Service: Gastroenterology;  Laterality: N/A;   FRACTURE SURGERY     arm requiring surgery on tendons    Prior to Admission medications   Medication Sig Start Date End Date Taking? Authorizing Provider  amLODipine (NORVASC) 10 MG tablet Take 1 tablet (10 mg total) by mouth daily. 08/08/23  Yes Simmons-Robinson, Makiera, MD  ezetimibe (ZETIA) 10 MG tablet Take 1 tablet (10 mg total) by mouth daily. 08/08/23  Yes Simmons-Robinson, Makiera, MD  meloxicam (MOBIC) 15 MG tablet Take 1 tablet (15 mg total) by mouth daily as needed for pain. Take as needed for moderate to severe pain 04/18/22  Yes Bosie Clos, MD  telmisartan (MICARDIS) 20 MG tablet Take 1 tablet (20 mg total) by mouth daily. 08/08/23  Yes Simmons-Robinson, Makiera, MD  colchicine 0.6 MG tablet  12/11/21   [provider]    Allergies as of 08/28/2023   (No Known Allergies)    Family History  Problem Relation Age of Onset   Heart disease Mother    Hypertension Mother    Lung cancer Father    Varicose Veins Father    Diabetes Sister    Heart disease Sister    Cirrhosis Sister        non  alcoholic   Depression Daughter     Social History   Socioeconomic History   Marital status: Divorced    Spouse name: Not on file   Number of children: Not on file   Years of education: Not on file   Highest education level: Associate degree: occupational, Scientist, product/process development, or vocational program  Occupational History   Not on file  Tobacco Use   Smoking status: Former    Current packs/day: 0.50    Average packs/day: 0.5 packs/day for 6.0 years (3.0 ttl pk-yrs)    Types: Cigarettes   Smokeless tobacco: Former    Quit date: 10/31/1979  Substance and Sexual Activity   Alcohol use: No   Drug use: No   Sexual activity: Not Currently  Other Topics Concern   Not on file  Social History Narrative   Not on file   Social Determinants of Health   Financial Resource Strain: Low Risk  (07/23/2023)   Overall Financial Resource Strain (CARDIA)    Difficulty of Paying Living Expenses: Not hard at all  Food Insecurity: No Food Insecurity (07/23/2023)   Hunger Vital Sign    Worried About Running Out of Food in the Last Year: Never true    Ran Out of Food in the Last Year: Never true  Transportation Needs: No Transportation Needs (07/23/2023)   PRAPARE - Administrator, Civil Service (Medical): No    Lack of Transportation (  Non-Medical): No  Physical Activity: Insufficiently Active (07/23/2023)   Exercise Vital Sign    Days of Exercise per Week: 2 days    Minutes of Exercise per Session: 20 min  Stress: No Stress Concern Present (01/30/2023)   [employer] of Occupational Health - Occupational Stress Questionnaire    Feeling of Stress : Only a little  Social Connections: Unknown (07/23/2023)   Social Connection and Isolation Panel [NHANES]    Frequency of Communication with Friends and Family: Once a week    Frequency of Social Gatherings with Friends and Family: More than three times a week    Attends Religious Services: Not on Insurance claims handler of Clubs or Organizations: No     Attends Banker Meetings: Never    Marital Status: Divorced  Catering manager Violence: Not At Risk (07/24/2023)   Humiliation, Afraid, Rape, and Kick questionnaire    Fear of Current or Ex-Partner: No    Emotionally Abused: No    Physically Abused: No    Sexually Abused: No    Review of Systems: See HPI, otherwise negative ROS  Physical Exam: BP (!) 150/83   Pulse 70   Temp (!) 97.4 F (36.3 C) (Temporal)   Resp 17   Ht 6' (1.829 m)   Wt 127.9 kg   SpO2 99%   BMI 38.25 kg/m  General:   Alert,  pleasant and cooperative in NAD Head:  Normocephalic and atraumatic. Lungs:  Clear to auscultation.    Heart:  Regular rate and rhythm.   Impression/Plan: Jonathon Bell is here for ophthalmic surgery.  Risks, benefits, limitations, and alternatives regarding ophthalmic surgery have been reviewed with the patient.  Questions have been answered.  All parties agreeable.   Lockie Mola, MD  10/03/2023, 10:29 AM

## 2023-10-03 NOTE — Anesthesia Postprocedure Evaluation (Signed)
Anesthesia Post Note  Patient: Jonathon Bell  Procedure(s) Performed: CATARACT EXTRACTION PHACO AND INTRAOCULAR LENS PLACEMENT (IOC) LEFT 4.14 00:27.8 (Left)  Patient location during evaluation: PACU Anesthesia Type: MAC Level of consciousness: awake and alert Pain management: pain level controlled Vital Signs Assessment: post-procedure vital signs reviewed and stable Respiratory status: spontaneous breathing, nonlabored ventilation, respiratory function stable and patient connected to nasal cannula oxygen Cardiovascular status: stable and blood pressure returned to baseline Postop Assessment: no apparent nausea or vomiting Anesthetic complications: no   No notable events documented.   Last Vitals:  Vitals:   10/03/23 1155 10/03/23 1157  BP: 124/84 129/80  Pulse: (!) 58 60  Resp: 12 13  Temp:  (!) 36.2 C  SpO2: 98% 97%    Last Pain:  Vitals:   10/03/23 1157  TempSrc:   PainSc: 0-No pain                 Yevette Edwards

## 2023-10-03 NOTE — Op Note (Signed)
OPERATIVE NOTE  JASAN POTEMPA [id number] 10/03/2023   PREOPERATIVE DIAGNOSIS:  Nuclear sclerotic cataract left eye. H25.12   POSTOPERATIVE DIAGNOSIS:    Nuclear sclerotic cataract left eye.     PROCEDURE:  Phacoemusification with posterior chamber intraocular lens placement of the left eye  Ultrasound time: Procedure(s): CATARACT EXTRACTION PHACO AND INTRAOCULAR LENS PLACEMENT (IOC) LEFT 4.14 00:27.8 (Left)  LENS:   Implant Name Type Inv. Item Serial No. Manufacturer Lot No. LRB No. Used Action  LENS CLAREON VIVITY 20 - [id number]  LENS CLAREON VIVITY 20 [id number] [facility]  Left 1 Wasted  LENS CLAREON VIVITY 20 - [id number]  LENS CLAREON VIVITY 20 [id number] [employer]  Left 1 Implanted    First IOL damaged in injector..  Second IOL injected.  SURGEON:  Deirdre Evener, MD   ANESTHESIA:  Topical with tetracaine drops and 2% Xylocaine jelly, augmented with 1% preservative-free intracameral lidocaine.    COMPLICATIONS:  None.   DESCRIPTION OF PROCEDURE:  The patient was identified in the holding room and transported to the operating room and placed in the supine position under the operating microscope.  The left eye was identified as the operative eye and it was prepped and draped in the usual sterile ophthalmic fashion.   A 1 millimeter clear-corneal paracentesis was made at the 1:30 position.  0.5 ml of preservative-free 1% lidocaine was injected into the anterior chamber.  The anterior chamber was filled with Viscoat viscoelastic.  A 2.4 millimeter keratome was used to make a near-clear corneal incision at the 10:30 position.  .  A curvilinear capsulorrhexis was made with a cystotome and capsulorrhexis forceps.  Balanced salt solution was used to hydrodissect and hydrodelineate the nucleus.   Phacoemulsification was then used in stop and chop fashion to remove the lens nucleus and epinucleus.  The remaining cortex was then removed using the irrigation and aspiration  handpiece. Provisc was then placed into the capsular bag to distend it for lens placement.  A lens was then injected into the capsular bag.  The remaining viscoelastic was aspirated.   Wounds were hydrated with balanced salt solution.  The anterior chamber was inflated to a physiologic pressure with balanced salt solution.  No wound leaks were noted. Cefuroxime 0.1 ml of a 10mg /ml solution was injected into the anterior chamber for a dose of 1 mg of intracameral antibiotic at the completion of the case.   Timolol and Brimonidine drops were applied to the eye.  The patient was taken to the recovery room in stable condition without complications of anesthesia or surgery.  Lenzie Montesano 10/03/2023, 11:51 AM

## 2023-10-03 NOTE — Transfer of Care (Signed)
Immediate Anesthesia Transfer of Care Note  Patient: Jonathon Bell  Procedure(s) Performed: CATARACT EXTRACTION PHACO AND INTRAOCULAR LENS PLACEMENT (IOC) LEFT 4.14 00:27.8 (Left)  Patient Location: PACU  Anesthesia Type: MAC  Level of Consciousness: awake, alert  and patient cooperative  Airway and Oxygen Therapy: Patient Spontanous Breathing and Patient connected to supplemental oxygen  Post-op Assessment: Post-op Vital signs reviewed, Patient's Cardiovascular Status Stable, Respiratory Function Stable, Patent Airway and No signs of Nausea or vomiting  Post-op Vital Signs: Reviewed and stable  Complications: No notable events documented.

## 2023-10-03 NOTE — Anesthesia Preprocedure Evaluation (Signed)
Anesthesia Evaluation  Patient identified by MRN, date of birth, ID band Patient awake    Reviewed: Allergy & Precautions, H&P , NPO status , Patient's Chart, lab work & pertinent test results, reviewed documented beta blocker date and time   Airway Mallampati: II  TM Distance: >3 FB Neck ROM: full    Dental no notable dental hx. (+) Teeth Intact   Pulmonary neg pulmonary ROS, former smoker   Pulmonary exam normal breath sounds clear to auscultation       Cardiovascular Exercise Tolerance: Good hypertension, On Medications negative cardio ROS  Rhythm:regular Rate:Normal     Neuro/Psych  Neuromuscular disease  negative psych ROS   GI/Hepatic Neg liver ROS, hiatal hernia,GERD  Medicated,,  Endo/Other  negative endocrine ROSdiabetes, Well Controlled    Renal/GU      Musculoskeletal   Abdominal   Peds  Hematology negative hematology ROS (+)   Anesthesia Other Findings   Reproductive/Obstetrics negative OB ROS                             Anesthesia Physical Anesthesia Plan  ASA: 3  Anesthesia Plan: MAC   Post-op Pain Management:    Induction:   PONV Risk Score and Plan:   Airway Management Planned:   Additional Equipment:   Intra-op Plan:   Post-operative Plan:   Informed Consent: I have reviewed the patients History and Physical, chart, labs and discussed the procedure including the risks, benefits and alternatives for the proposed anesthesia with the patient or authorized representative who has indicated his/her understanding and acceptance.       Plan Discussed with: CRNA  Anesthesia Plan Comments:        Anesthesia Quick Evaluation

## 2023-10-08 ENCOUNTER — Encounter: Payer: Self-pay | Admitting: Ophthalmology

## 2024-07-29 ENCOUNTER — Ambulatory Visit (INDEPENDENT_AMBULATORY_CARE_PROVIDER_SITE_OTHER): Payer: Medicare Other

## 2024-07-29 VITALS — BP 138/82

## 2024-07-29 DIAGNOSIS — Z0000 Encounter for general adult medical examination without abnormal findings: Secondary | ICD-10-CM | POA: Diagnosis not present

## 2024-07-29 NOTE — Patient Instructions (Addendum)
 Jonathon Bell,  Thank you for taking the time for your Medicare Wellness Visit. I appreciate your continued commitment to your health goals. Please review the care plan we discussed, and feel free to reach out if I can assist you further.  Medicare recommends these wellness visits once per year to help you and your care team stay ahead of potential health issues. These visits are designed to focus on prevention, allowing your provider to concentrate on managing your acute and chronic conditions during your regular appointments.  Please note that Annual Wellness Visits do not include a physical exam. Some assessments may be limited, especially if the visit was conducted virtually. If needed, we may recommend a separate in-person follow-up with your provider.  Ongoing Care Seeing your primary care provider every 3 to 6 months helps us  monitor your health and provide consistent, personalized care.   Referrals If a referral was made during today's visit and you haven't received any updates within two weeks, please contact the referred provider directly to check on the status.  Recommended Screenings:  Health Maintenance  Topic Date Due   Pneumococcal Vaccine for age over 70 (1 of 1 - PCV) Never done   Zoster (Shingles) Vaccine (1 of 2) 10/17/2002   DTaP/Tdap/Td vaccine (3 - Td or Tdap) 05/26/2024   Flu Shot  Never done   COVID-19 Vaccine (1 - 2024-25 season) Never done   Medicare Annual Wellness Visit  07/29/2025   Colon Cancer Screening  08/08/2028   Hepatitis C Screening  Completed   HPV Vaccine  Aged Out   Meningitis B Vaccine  Aged Out     Advance Care Planning is important because it: Ensures you receive medical care that aligns with your values, goals, and preferences. Provides guidance to your family and loved ones, reducing the emotional burden of decision-making during critical moments.  Vision: Annual vision screenings are recommended for early detection of glaucoma, cataracts,  and diabetic retinopathy. These exams can also reveal signs of chronic conditions such as diabetes and high blood pressure.  Dental: Annual dental screenings help detect early signs of oral cancer, gum disease, and other conditions linked to overall health, including heart disease and diabetes.  Please see the attached documents for additional preventive care recommendations.   NEXT AWV 08/04/25 @ 10:10 AM IN PERSON

## 2024-07-29 NOTE — Progress Notes (Signed)
 Subjective:   Jonathon Bell is a 73 y.o. who presents for a Medicare Wellness preventive visit.  As a reminder, Annual Wellness Visits don't include a physical exam, and some assessments may be limited, especially if this visit is performed virtually. We may recommend an in-person follow-up visit with your provider if needed.  Visit Complete: In person   Persons Participating in Visit: Patient.  AWV Questionnaire: No: Patient Medicare AWV questionnaire was not completed prior to this visit.  Cardiac Risk Factors include: advanced age (>50men, >7 women);male gender;hypertension;dyslipidemia;obesity (BMI >30kg/m2)     Objective:    Today's Vitals   07/29/24 0955  BP: (!) 152/90  Weight: 298 lb 14.4 oz (135.6 kg)  Height: 6' (1.829 m)   Body mass index is 40.54 kg/m.     07/29/2024   10:06 AM 10/03/2023   10:20 AM 09/19/2023    9:00 AM 07/24/2023   10:27 AM 07/20/2022    1:12 PM 08/08/2021    8:43 AM 08/09/2015    8:12 AM  Advanced Directives  Does Patient Have a Medical Advance Directive? No No No No No No No   Would patient like information on creating a medical advance directive? No - Patient declined Yes (MAU/Ambulatory/Procedural Areas - Information given) No - Patient declined  No - Patient declined       Data saved with a previous flowsheet row definition    Current Medications (verified) Outpatient Encounter Medications as of 07/29/2024  Medication Sig   amLODipine  (NORVASC ) 10 MG tablet Take 1 tablet (10 mg total) by mouth daily.   telmisartan  (MICARDIS ) 20 MG tablet Take 1 tablet (20 mg total) by mouth daily.   colchicine 0.6 MG tablet  (Patient not taking: Reported on 07/29/2024)   ezetimibe  (ZETIA ) 10 MG tablet Take 1 tablet (10 mg total) by mouth daily. (Patient not taking: Reported on 07/29/2024)   meloxicam  (MOBIC ) 15 MG tablet Take 1 tablet (15 mg total) by mouth daily as needed for pain. Take as needed for moderate to severe pain (Patient not taking:  Reported on 07/29/2024)   No facility-administered encounter medications on file as of 07/29/2024.    Allergies (verified) Patient has no known allergies.   History: Past Medical History:  Diagnosis Date   Allergy    Arthritis    GERD (gastroesophageal reflux disease)    Hypertension    Past Surgical History:  Procedure Laterality Date   CATARACT EXTRACTION W/PHACO Right 09/19/2023   Procedure: CATARACT EXTRACTION PHACO AND INTRAOCULAR LENS PLACEMENT (IOC) RIGHT  CLAREON VIVITY LENS  5.96  00:31.9;  Surgeon: Mittie Gaskin, MD;  Location: [facility];  Service: Ophthalmology;  Laterality: Right;   CATARACT EXTRACTION W/PHACO Left 10/03/2023   Procedure: CATARACT EXTRACTION PHACO AND INTRAOCULAR LENS PLACEMENT (IOC) LEFT 4.14 00:27.8;  Surgeon: Mittie Gaskin, MD;  Location: [facility];  Service: Ophthalmology;  Laterality: Left;   COLONOSCOPY WITH PROPOFOL  N/A 08/08/2021   Procedure: COLONOSCOPY WITH PROPOFOL ;  Surgeon: Unk Corinn Skiff, MD;  Location: [facility];  Service: Gastroenterology;  Laterality: N/A;   FRACTURE SURGERY     arm requiring surgery on tendons   Family History  Problem Relation Age of Onset   Heart disease Mother    Hypertension Mother    Lung cancer Father    Varicose Veins Father    Diabetes Sister    Heart disease Sister    Cirrhosis Sister        non alcoholic   Depression Daughter  Social History   Socioeconomic History   Marital status: Divorced    Spouse name: Not on file   Number of children: Not on file   Years of education: Not on file   Highest education level: Associate degree: occupational, Scientist, product/process development, or vocational program  Occupational History   Not on file  Tobacco Use   Smoking status: Former    Current packs/day: 0.50    Average packs/day: 0.5 packs/day for 6.0 years (3.0 ttl pk-yrs)    Types: Cigarettes   Smokeless tobacco: Former    Quit date: 10/31/1979  Substance and Sexual Activity    Alcohol use: No   Drug use: No   Sexual activity: Not Currently  Other Topics Concern   Not on file  Social History Narrative   Not on file   Social Drivers of Health   Financial Resource Strain: Low Risk  (07/29/2024)   Overall Financial Resource Strain (CARDIA)    Difficulty of Paying Living Expenses: Not hard at all  Food Insecurity: No Food Insecurity (07/29/2024)   Hunger Vital Sign    Worried About Running Out of Food in the Last Year: Never true    Ran Out of Food in the Last Year: Never true  Transportation Needs: No Transportation Needs (07/29/2024)   PRAPARE - Administrator, Civil Service (Medical): No    Lack of Transportation (Non-Medical): No  Physical Activity: Sufficiently Active (07/29/2024)   Exercise Vital Sign    Days of Exercise per Week: 5 days    Minutes of Exercise per Session: 60 min  Recent Concern: Physical Activity - Insufficiently Active (07/22/2024)   Exercise Vital Sign    Days of Exercise per Week: 2 days    Minutes of Exercise per Session: 20 min  Stress: No Stress Concern Present (07/29/2024)   [employer] of Occupational Health - Occupational Stress Questionnaire    Feeling of Stress: Not at all  Social Connections: Socially Isolated (07/29/2024)   Social Connection and Isolation Panel    Frequency of Communication with Friends and Family: Three times a week    Frequency of Social Gatherings with Friends and Family: Once a week    Attends Religious Services: Never    Database administrator or Organizations: No    Attends Engineer, structural: Never    Marital Status: Divorced    Tobacco Counseling Counseling given: Not Answered    Clinical Intake:  Pre-visit preparation completed: Yes  Pain : No/denies pain     BMI - recorded: 40.54 Nutritional Status: BMI > 30  Obese Nutritional Risks: None Diabetes: No  No results found for: HGBA1C   How often do you need to have someone help you when you read  instructions, pamphlets, or other written materials from your doctor or pharmacy?: 1 - Never  Interpreter Needed?: No  Information entered by :: Jonathon DAS, LPN   Activities of Daily Living     07/29/2024   10:08 AM 07/22/2024    5:41 PM  In your present state of health, do you have any difficulty performing the following activities:  Hearing? 0 0  Vision? 0 0  Difficulty concentrating or making decisions? 0 0  Walking or climbing stairs? 1 1  Comment WITH KNEE PAIN   Dressing or bathing? 0 0  Doing errands, shopping? 0 0  Preparing Food and eating ? N N  Using the Toilet? N N  In the past six months, have you accidently leaked urine?  N N  Do you have problems with loss of bowel control? N N  Managing your Medications? N N  Managing your Finances? N N  Housekeeping or managing your Housekeeping? N N    Patient Care Team: Sharma Coyer, MD as PCP - General (Family Medicine)  I have updated your Care Teams any recent Medical Services you may have received from other providers in the past year.     Assessment:   This is a routine wellness examination for [place].  Hearing/Vision screen Hearing Screening - Comments:: NO AIDS Vision Screening - Comments:: NO GLASSES, HAD CATARACT SGY IN NOV- DR.DINGELDEIN   Goals Addressed             This Visit's Progress    DIET - INCREASE WATER INTAKE         Depression Screen     07/29/2024   10:04 AM 07/24/2023   10:27 AM 07/20/2022    1:10 PM 04/18/2022    8:18 AM 11/23/2021    8:19 AM 07/19/2021    9:43 AM 10/31/2017   10:11 AM  PHQ 2/9 Scores  PHQ - 2 Score 0 0 0 0 0 0 1  PHQ- 9 Score 0  0 0 0 2 3    Fall Risk     07/29/2024   10:08 AM 07/22/2024    5:41 PM 07/23/2023    3:24 PM 07/20/2022    1:13 PM 07/20/2022   12:49 PM  Fall Risk   Falls in the past year? 0 0 0 0 0  Number falls in past yr: 0 0 0 0 0  Injury with Fall? 0 0 0 0 0  Risk for fall due to : No Fall Risks  No Fall Risks No Fall Risks    Follow up Falls evaluation completed;Falls prevention discussed  Falls prevention discussed;Education provided Falls prevention discussed;Falls evaluation completed       Data saved with a previous flowsheet row definition    MEDICARE RISK AT HOME:  Medicare Risk at Home Any stairs in or around the home?: No If so, are there any without handrails?: No Home free of loose throw rugs in walkways, pet beds, electrical cords, etc?: Yes Adequate lighting in your home to reduce risk of falls?: Yes Life alert?: No Use of a cane, walker or w/c?: No Grab bars in the bathroom?: No Shower chair or bench in shower?: No Elevated toilet seat or a handicapped toilet?: No  TIMED UP AND GO:  Was the test performed?  Yes  Length of time to ambulate 10 feet: 4 sec Gait steady and fast without use of assistive device  Cognitive Function: 6CIT completed        07/29/2024   10:11 AM 07/24/2023   10:28 AM 07/20/2022    1:20 PM  6CIT Screen  What Year? 0 points 0 points 0 points  What month? 0 points 0 points 0 points  What time? 0 points 0 points 0 points  Count back from 20 0 points 0 points 0 points  Months in reverse 0 points 0 points 0 points  Repeat phrase 0 points 0 points 0 points  Total Score 0 points 0 points 0 points    Immunizations Immunization History  Administered Date(s) Administered   Td 08/11/2004   Tdap 05/26/2014   Zoster, Live 05/26/2014    Screening Tests Health Maintenance  Topic Date Due   Pneumococcal Vaccine: 50+ Years (1 of 1 - PCV) Never done   Zoster  Vaccines- Shingrix (1 of 2) 10/17/2002   DTaP/Tdap/Td (3 - Td or Tdap) 05/26/2024   Influenza Vaccine  Never done   COVID-19 Vaccine (1 - 2024-25 season) Never done   Medicare Annual Wellness (AWV)  07/29/2025   Colonoscopy  08/08/2028   Hepatitis C Screening  Completed   HPV VACCINES  Aged Out   Meningococcal B Vaccine  Aged Out    Health Maintenance Items Addressed: DECLINES FLU, PNA & COVID SHOTS; UP  TO DATE ON COLONOSCOPY  Additional Screening:  Vision Screening: Recommended annual ophthalmology exams for early detection of glaucoma and other disorders of the eye. Is the patient up to date with their annual eye exam?  Yes  Who is the provider or what is the name of the office in which the patient attends annual eye exams? DR.DINGELDEIN  Dental Screening: Recommended annual dental exams for proper oral hygiene  Community Resource Referral / Chronic Care Management: CRR required this visit?  No   CCM required this visit?  No   Plan:    I have personally reviewed and noted the following in the patient's chart:   Medical and social history Use of alcohol, tobacco or illicit drugs  Current medications and supplements including opioid prescriptions. Patient is not currently taking opioid prescriptions. Functional ability and status Nutritional status Physical activity Advanced directives List of other physicians Hospitalizations, surgeries, and ER visits in previous 12 months Vitals Screenings to include cognitive, depression, and falls Referrals and appointments  In addition, I have reviewed and discussed with patient certain preventive protocols, quality metrics, and best practice recommendations. A written personalized care plan for preventive services as well as general preventive health recommendations were provided to patient.   Jonathon GORMAN Das, LPN   [id number]   After Visit Summary: (In Person-Declined) Patient declined AVS at this time.  Notes: Nothing significant to report at this time.

## 2025-08-04 ENCOUNTER — Ambulatory Visit
